# Patient Record
Sex: Male | Born: 1975 | Race: White | Hispanic: No | Marital: Married | Smoking: Current every day smoker
Health system: Southern US, Community
[De-identification: ages and names within clinical notes are randomized; demographics above are authoritative.]

## PROBLEM LIST (undated history)

## (undated) DIAGNOSIS — F1010 Alcohol abuse, uncomplicated: Secondary | ICD-10-CM

## (undated) DIAGNOSIS — S8990XA Unspecified injury of unspecified lower leg, initial encounter: Secondary | ICD-10-CM

## (undated) DIAGNOSIS — B009 Herpesviral infection, unspecified: Secondary | ICD-10-CM

## (undated) DIAGNOSIS — F411 Generalized anxiety disorder: Secondary | ICD-10-CM

## (undated) DIAGNOSIS — E785 Hyperlipidemia, unspecified: Secondary | ICD-10-CM

## (undated) DIAGNOSIS — R7303 Prediabetes: Secondary | ICD-10-CM

## (undated) DIAGNOSIS — E559 Vitamin D deficiency, unspecified: Secondary | ICD-10-CM

## (undated) HISTORY — DX: Generalized anxiety disorder: F41.1

## (undated) HISTORY — PX: DEBRIDEMENT LEG: SUR390

## (undated) HISTORY — DX: Prediabetes: R73.03

## (undated) HISTORY — DX: Unspecified injury of unspecified lower leg, initial encounter: S89.90XA

## (undated) HISTORY — DX: Alcohol abuse, uncomplicated: F10.10

## (undated) HISTORY — DX: Vitamin D deficiency, unspecified: E55.9

## (undated) HISTORY — DX: Herpesviral infection, unspecified: B00.9

## (undated) HISTORY — DX: Hyperlipidemia, unspecified: E78.5

---

## 1994-11-29 HISTORY — PX: ANKLE FRACTURE SURGERY: SHX122

## 2009-07-27 ENCOUNTER — Inpatient Hospital Stay (HOSPITAL_COMMUNITY): Admission: EM | Admit: 2009-07-27 | Discharge: 2009-07-28 | Payer: Self-pay | Admitting: Emergency Medicine

## 2010-09-04 ENCOUNTER — Ambulatory Visit (HOSPITAL_COMMUNITY): Admission: RE | Admit: 2010-09-04 | Discharge: 2010-09-04 | Payer: Self-pay | Admitting: Internal Medicine

## 2010-10-12 ENCOUNTER — Ambulatory Visit (HOSPITAL_COMMUNITY): Admission: RE | Admit: 2010-10-12 | Discharge: 2010-10-13 | Payer: Self-pay | Admitting: Orthopedic Surgery

## 2010-11-27 ENCOUNTER — Ambulatory Visit (HOSPITAL_COMMUNITY)
Admission: RE | Admit: 2010-11-27 | Discharge: 2010-11-27 | Payer: Self-pay | Source: Home / Self Care | Attending: Internal Medicine | Admitting: Internal Medicine

## 2010-12-18 ENCOUNTER — Ambulatory Visit (HOSPITAL_COMMUNITY): Admission: RE | Admit: 2010-12-18 | Payer: Self-pay | Source: Home / Self Care | Admitting: Orthopedic Surgery

## 2011-01-29 ENCOUNTER — Other Ambulatory Visit: Payer: Self-pay | Admitting: Orthopedic Surgery

## 2011-01-29 ENCOUNTER — Ambulatory Visit (HOSPITAL_BASED_OUTPATIENT_CLINIC_OR_DEPARTMENT_OTHER)
Admission: RE | Admit: 2011-01-29 | Discharge: 2011-01-29 | Disposition: A | Discharge status: Home / Self Care | Payer: 59 | Source: Ambulatory Visit | Attending: Orthopedic Surgery | Admitting: Orthopedic Surgery

## 2011-01-29 DIAGNOSIS — L910 Hypertrophic scar: Secondary | ICD-10-CM | POA: Insufficient documentation

## 2011-01-29 DIAGNOSIS — Z1833 Retained wood fragments: Secondary | ICD-10-CM | POA: Insufficient documentation

## 2011-01-29 DIAGNOSIS — L923 Foreign body granuloma of the skin and subcutaneous tissue: Secondary | ICD-10-CM | POA: Insufficient documentation

## 2011-01-29 LAB — POCT HEMOGLOBIN-HEMACUE: Hemoglobin: 15 g/dL (ref 13.0–17.0)

## 2011-02-01 LAB — WOUND CULTURE

## 2011-02-03 LAB — ANAEROBIC CULTURE

## 2011-02-09 LAB — CULTURE, ROUTINE-ABSCESS

## 2011-02-09 LAB — ANAEROBIC CULTURE

## 2011-02-09 LAB — CBC
HCT: 41.1 % (ref 39.0–52.0)
Hemoglobin: 13.5 g/dL (ref 13.0–17.0)
MCH: 29.7 pg (ref 26.0–34.0)
MCHC: 32.8 g/dL (ref 30.0–36.0)
MCV: 90.3 fL (ref 78.0–100.0)
Platelets: 184 10*3/uL (ref 150–400)
RBC: 4.55 MIL/uL (ref 4.22–5.81)
RDW: 12.3 % (ref 11.5–15.5)
WBC: 9.8 10*3/uL (ref 4.0–10.5)

## 2011-02-09 LAB — PROTIME-INR
INR: 1.07 (ref 0.00–1.49)
Prothrombin Time: 14.1 seconds (ref 11.6–15.2)

## 2011-02-09 LAB — SURGICAL PCR SCREEN
MRSA, PCR: NEGATIVE
Staphylococcus aureus: NEGATIVE

## 2011-03-06 LAB — CBC
HCT: 39.5 % (ref 39.0–52.0)
Hemoglobin: 13.5 g/dL (ref 13.0–17.0)
MCHC: 34.1 g/dL (ref 30.0–36.0)
MCV: 94.4 fL (ref 78.0–100.0)
Platelets: 191 10*3/uL (ref 150–400)
RBC: 4.19 MIL/uL — ABNORMAL LOW (ref 4.22–5.81)
RDW: 13.4 % (ref 11.5–15.5)
WBC: 14.9 10*3/uL — ABNORMAL HIGH (ref 4.0–10.5)

## 2011-03-06 LAB — TYPE AND SCREEN
ABO/RH(D): A POS
Antibody Screen: NEGATIVE

## 2011-03-06 LAB — LACTIC ACID, PLASMA: Lactic Acid, Venous: 3.1 mmol/L — ABNORMAL HIGH (ref 0.5–2.2)

## 2011-03-06 LAB — PROTIME-INR
INR: 1.1 (ref 0.00–1.49)
Prothrombin Time: 14.5 seconds (ref 11.6–15.2)

## 2011-03-06 LAB — POCT I-STAT, CHEM 8
BUN: 22 mg/dL (ref 6–23)
Calcium, Ion: 1.16 mmol/L (ref 1.12–1.32)
Chloride: 104 mEq/L (ref 96–112)
Creatinine, Ser: 1.2 mg/dL (ref 0.4–1.5)
Glucose, Bld: 187 mg/dL — ABNORMAL HIGH (ref 70–99)
HCT: 42 % (ref 39.0–52.0)
Hemoglobin: 14.3 g/dL (ref 13.0–17.0)
Potassium: 3.7 mEq/L (ref 3.5–5.1)
Sodium: 138 mEq/L (ref 135–145)
TCO2: 24 mmol/L (ref 0–100)

## 2011-03-06 LAB — ABO/RH: ABO/RH(D): A POS

## 2011-04-13 NOTE — H&P (Signed)
NAMESCHYLER, COUNSELL NO.:  [id number]   MEDICAL RECORD NO.:  [id number]          PATIENT TYPE:  EMS   LOCATION:  MAJO                         FACILITY:  [facility]   PHYSICIAN:  Sandria Bales. Ezzard Standing, M.D.  DATE OF BIRTH:  1976-08-06   DATE OF ADMISSION:  07/27/2009  DATE OF DISCHARGE:                              HISTORY & PHYSICAL   HISTORY OF PRESENT ILLNESS:  This is a 35 year old white male who was  upgraded to a Gold Trauma in the emergency room, I guess because of  mechanism of injury.   His story is, he was in a tree, cutting branches, fell approximately 12  feet, and impelled a tree branch on the inner aspect of his right thigh.  He then got in the car, drove home, and his wife brought him to the  [facility] ER.  He has one loose tooth but he has no head injury, no  loss of consciousness, no chest injury, and no abdominal injury.   He clearly was stable enough to drive a car (a short distance) and  stable enough to let his wife drive him to the ER.   PAST MEDICAL HISTORY:  He has no allergies.   MEDICATIONS:  He is on no medications.   REVIEW OF SYSTEMS:  NEUROLOGIC:  No seizure or loss of consciousness.  PULMONARY:  No history of pneumonia or lung disease.  CARDIAC:  No heart disease or chest pain.  GASTROINTESTINAL:  No history peptic ulcer disease or liver disease.  UROLOGIC:  No history of kidney stones or kidney infections.   His wife was there with him in the emergency room.   PHYSICAL EXAMINATION:  VITAL SIGNS:  His blood pressure is 111/70, pulse  68, and he is saturating at 100%.  GENERAL:  He is a well-nourished, healthy male, alert and cooperative.  NEUROLOGIC:  Grossly intact and oriented.  HEENT:  His pupils are about 3-4 mm and reactive.  He has a loose upper  left incisor but no other obvious oral lesion.  His TMs are unremarkable  bilaterally.  He has no facial injury.  NECK:  Nontender without any evidence of injury.  He moves it  without  pain.  LUNGS:  Clear to auscultation with symmetric breath sounds.  HEART:  Regular rate and rhythm.  I hear no murmur or rub.  ABDOMEN:  Soft.  His pelvis is stable.  He has no evidence of any  abdominal injury or laceration.  BACK:  No laceration or injury to his back.  EXTREMITIES:  In his right thigh, he has a branch which is about 3 cm in  diameter poking out the left medial thigh.  The branch looks like it is  directed posterior to his vascular bundle.  I can feel palpable dorsalis  pedis on the right foot.  His sensation is grossly intact on the right  side.  His left leg and both arms are otherwise unremarkable.   IMPRESSION:  1. Impelled branch, right thigh.  We will plan to x-ray this, but      doubt  any bony injuries as he got up to walk to      his car and drove.  I do not have any evidence of a vascular      injury.  I will take him to the operating room and I will remove      the branch and debride the wound.  2. He has a loose upper left second incisor.      Sandria Bales. Ezzard Standing, M.D.  Electronically Signed     DHN/MEDQ  D:  07/27/2009  T:  07/27/2009  Job:  [id number]

## 2011-04-13 NOTE — Discharge Summary (Signed)
NAMEKHAZA, BLANSETT NO.:  [id number]   MEDICAL RECORD NO.:  [id number]          PATIENT TYPE:  INP   LOCATION:  5128                         FACILITY:  [facility]   PHYSICIAN:  Cherylynn Ridges, M.D.    DATE OF BIRTH:  March 28, 1976   DATE OF ADMISSION:  07/27/2009  DATE OF DISCHARGE:  07/28/2009                               DISCHARGE SUMMARY   DISCHARGE DIAGNOSES:  1. Fall.  2. Penetrating injury to right thigh.   CONSULTANTS:  None.   PROCEDURES:  Incision and drainage of right thigh wound with placement  of Penrose drain and closure by Dr. Ezzard Standing.   HISTORY OF PRESENT ILLNESS:  This is a 35 year old white male who was up  in a tree cutting some branches when he fell impaling his right thigh on  a branch.  This was cut off and he drove himself home and then his wife  drove him to the hospital.  He was made a level I trauma on arrival.  His workup did not demonstrate any significant vascular injury, and he  was taken to the operating room for significant I and D and closure.  This was carried out by Dr. Ezzard Standing without difficulty.  He was  transferred to the floor for further care.   HOSPITAL COURSE:  The patient did well in the hospital.  He was able to  ambulate without significant difficulty and had his pain controlled on  oral medication.  During his dressing change the following morning, the  Penrose was pulled out, but he did not have significant drainage nor any  erythema or signs of purulence.  He was able to be discharged home in  good condition in the care of his wife.   DISCHARGE MEDICATIONS:  Norco 5/325 take 1-2 p.o. q.4 h. p.r.n. pain,  #60 with no refill.   FOLLOWUP:  The patient will need to follow up in the Trauma Service  Clinic on August 07, 2009, for wound check and suture removal.  If he  has questions or concerns prior to that, he will call.      Earney Hamburg, P.A.      Cherylynn Ridges, M.D.  Electronically Signed    MJ/MEDQ   D:  07/28/2009  T:  07/29/2009  Job:  [id number]

## 2011-04-13 NOTE — Op Note (Signed)
NAMEMORDECHAI, Tracy Barrett NO.:  [id number]   MEDICAL RECORD NO.:  [id number]          PATIENT TYPE:  INP   LOCATION:  5128                         FACILITY:  [facility]   PHYSICIAN:  Sandria Bales. Ezzard Standing, M.D.  DATE OF BIRTH:  Jul 17, 1976   DATE OF PROCEDURE:  07/27/2009  DATE OF DISCHARGE:                               OPERATIVE REPORT   Date of Surgery ?   PREOPERATIVE DIAGNOSIS:  Branch impelled to medial aspect of right  thigh.   POSTOPERATIVE DIAGNOSIS:  Branch impelled to medial aspect of right  thigh, penetration approximately 18 cm.   PROCEDURE:  Removal of foreign body, irrigation of right thigh wound  with 6 liters of pulse lavage.   SURGEON:  Sandria Bales. Ezzard Standing, MD   FIRST ASSISTANT:  None.   ANESTHESIA:  General endotracheal.   ESTIMATED BLOOD LOSS:  Minimal.   INDICATIONS FOR PROCEDURE:  [name] is a 35 year old white male who  fell from a tree about 12 feet, impelled a branch on his right thigh,  came to the [facility] Emergency Room in stable condition.  He is now  come in to the operating room for removal of foreign body and  debridement.   He has been able to walk since being impelled and by x-ray, he has no  obvious bony injury.  He has palpable pedal pulses.  I do not think he  has any vascular injury.  He got a tetanus shot in the emergency room.   I discussed with him and his wife the need for debridement of this  wound.  The risks include, but are not limited to, infection, nerve  injury, which have already done and some other occult injury.   OPERATIVE NOTE:  The patient was placed in the supine position, given  the general anesthetic, was supervised by Dr. Adonis Huguenin, was given 1 g  of Ancef at the initiation of procedure.  A time out was held  identifying the patient and the procedure.   I prepped his right thigh and abdomen with Betadine solution.  I went on  and pulled the branch out before prepping just to get it out the way.  He  really had not much oozing from the wound at all.  After the wound  was prepped, we did a time-out to identifying the patient and procedure  according to the surgical checklist.  I then irrigated out the right  thigh wound with a pulse lavage using approximately 6 liters of saline  irrigation.  I then loosely closed the wound with interrupted 2-0 nylon  sutures.   I placed a Penrose drain in the wound covered with 4 x 4s and a Kerlix.  He tolerated the procedure well, was transported to the recovery room in  good condition.      Sandria Bales. Ezzard Standing, M.D.  Electronically Signed     DHN/MEDQ  D:  07/27/2009  T:  07/28/2009  Job:  [id number]

## 2011-06-08 NOTE — Op Note (Signed)
Tracy Barrett, Tracy Barrett                 ACCOUNT NO.:  [id number]  MEDICAL RECORD NO.:  [id number]          PATIENT TYPE:  AMB  LOCATION:  SDS                          FACILITY:  [facility]  PHYSICIAN:  Harvie Junior, M.D.   DATE OF BIRTH:  [birth year]  DATE OF PROCEDURE:  01/29/2011 DATE OF DISCHARGE:                              OPERATIVE REPORT   PREOPERATIVE DIAGNOSIS:  Retained foreign body with abscess right posterior thigh.  POSTOPERATIVE DIAGNOSIS:  Retained foreign body with abscess right posterior thigh.  PROCEDURES: 1. Removal of foreign body deep right thigh. 2. Irrigation and debridement of abscess right thigh. 3. Exploration defined and debridement of a large foreign body sac in     the right thigh. 4. Revision of previous keloid-type scar  SURGEON:  Harvie Junior, MD.  ASSISTANT:  Marshia Ly, PA  ANESTHESIA:  General.  BRIEF HISTORY:  Tracy Barrett is a 35 year old male with a long history having had a tree branch sort of going into his right thigh.  He had been treated previously by Dr. Turner Daniels who had found multiple parts of bark in his leg, and had irrigated and debrided it thoroughly.  Many months after this debridement, he began developing sort of abscess in his leg or some sort of a mass.  We saw him.  A repeat MRI was obtained, which showed there was some questionable stool foreign body versus some pus, and then it tracked down into the leg.  We evaluated him and felt that Dr. Turner Daniels had done everything correct, and that there potentially could be some more foreign body.  It was unclear, but he certainly had this new sort of granulation sac, and as such we felt that exploration was appropriate.  The patient wanted [place] to do it and he was brought to the operating room for exploration and debridement as needed.  DESCRIPTION OF PROCEDURE:  Patient brought to the operating room.  After adequate anesthesia obtained with a general anesthetic, the patient was placed  supine on the operating table, then was moved into the prone position.  Attention then turned to the right leg where after routine prep and drape, an incision was made which excised and ellipsed the old scar.  Once this keloid-type scar had been excised, we extended the incision proximally and slightly medially until allowed [place] to get access over medially and got down through this incision.  There was a very dense sort of a scar-type tissue.  We began dissecting this and it became clear that there was some sort of the sac.  We ultimately made of slight amount of an enterotomy, and in that enterotomy, there was some fluid which escaped.  We cultured it and began again more fully defining the sac.  We got the sac fully defined and excised.  Unfortunately, the sac had a tracking limb, and the tracking limb went straight down to the femur posteriorly and really started going medial to the femur.  We tracked it down very carefully and cautiously.  We could put an instrument in it. You could see the sac sort of tracked right down  to bone, kind of had an attachment to bone.  We found one large piece of tree bark which was excised and continued to follow this track all the way down, started to get thinner and thinner, and as we got to the medial side of the femur and tracking anteriorly, we felt that we could not track this any further.  At this point, the stalk was amputated, and a gloved finger could be placed medial to the femur following this area, and there were no additional hard of pieces which could be identified or felt in that area.  At this point, we did a thorough irrigation and debridement of the wound, controlled all bleeding with electrocautery, and sort of tried to milk this area for any additional pieces or problems.  Seeing none, the wound was then irrigated, suctioned dry.  We then closed in layers with Vicryl and staples.  A sterile compressive dressing was applied and the  patient taken to the recovery room where he was noted to be in satisfactory condition.  The sac and foreign bodies were sent to Pathology, and the patient was discharged to home.  We will follow him back in the office next week.     Harvie Junior, M.D.     Ranae Plumber  D:  01/29/2011  T:  01/30/2011  Job:  [id number]  Electronically Signed by Jodi Geralds M.D. on 02/03/2011 01:27:44 PM

## 2011-11-05 ENCOUNTER — Ambulatory Visit (HOSPITAL_COMMUNITY)
Admission: RE | Admit: 2011-11-05 | Discharge: 2011-11-05 | Disposition: A | Discharge status: Home / Self Care | Payer: 59 | Source: Ambulatory Visit | Attending: Internal Medicine | Admitting: Internal Medicine

## 2011-11-05 ENCOUNTER — Other Ambulatory Visit (HOSPITAL_COMMUNITY): Payer: Self-pay | Admitting: Internal Medicine

## 2011-11-05 DIAGNOSIS — Z0000 Encounter for general adult medical examination without abnormal findings: Secondary | ICD-10-CM | POA: Insufficient documentation

## 2011-11-05 DIAGNOSIS — I10 Essential (primary) hypertension: Secondary | ICD-10-CM

## 2011-11-05 DIAGNOSIS — R059 Cough, unspecified: Secondary | ICD-10-CM | POA: Insufficient documentation

## 2011-11-05 DIAGNOSIS — R05 Cough: Secondary | ICD-10-CM | POA: Insufficient documentation

## 2011-11-05 DIAGNOSIS — F17200 Nicotine dependence, unspecified, uncomplicated: Secondary | ICD-10-CM | POA: Insufficient documentation

## 2011-11-25 LAB — FECAL OCCULT BLOOD, GUAIAC: Fecal Occult Blood: NEGATIVE

## 2012-05-25 IMAGING — CR DG CHEST 2V
2 series · 2 of 2 positions shown · non-contrast
Comparison: 07/27/2009

CLINICAL DATA: Smoker with some cough during the last week.
Annual exam

[view not recorded (1 of 2)]
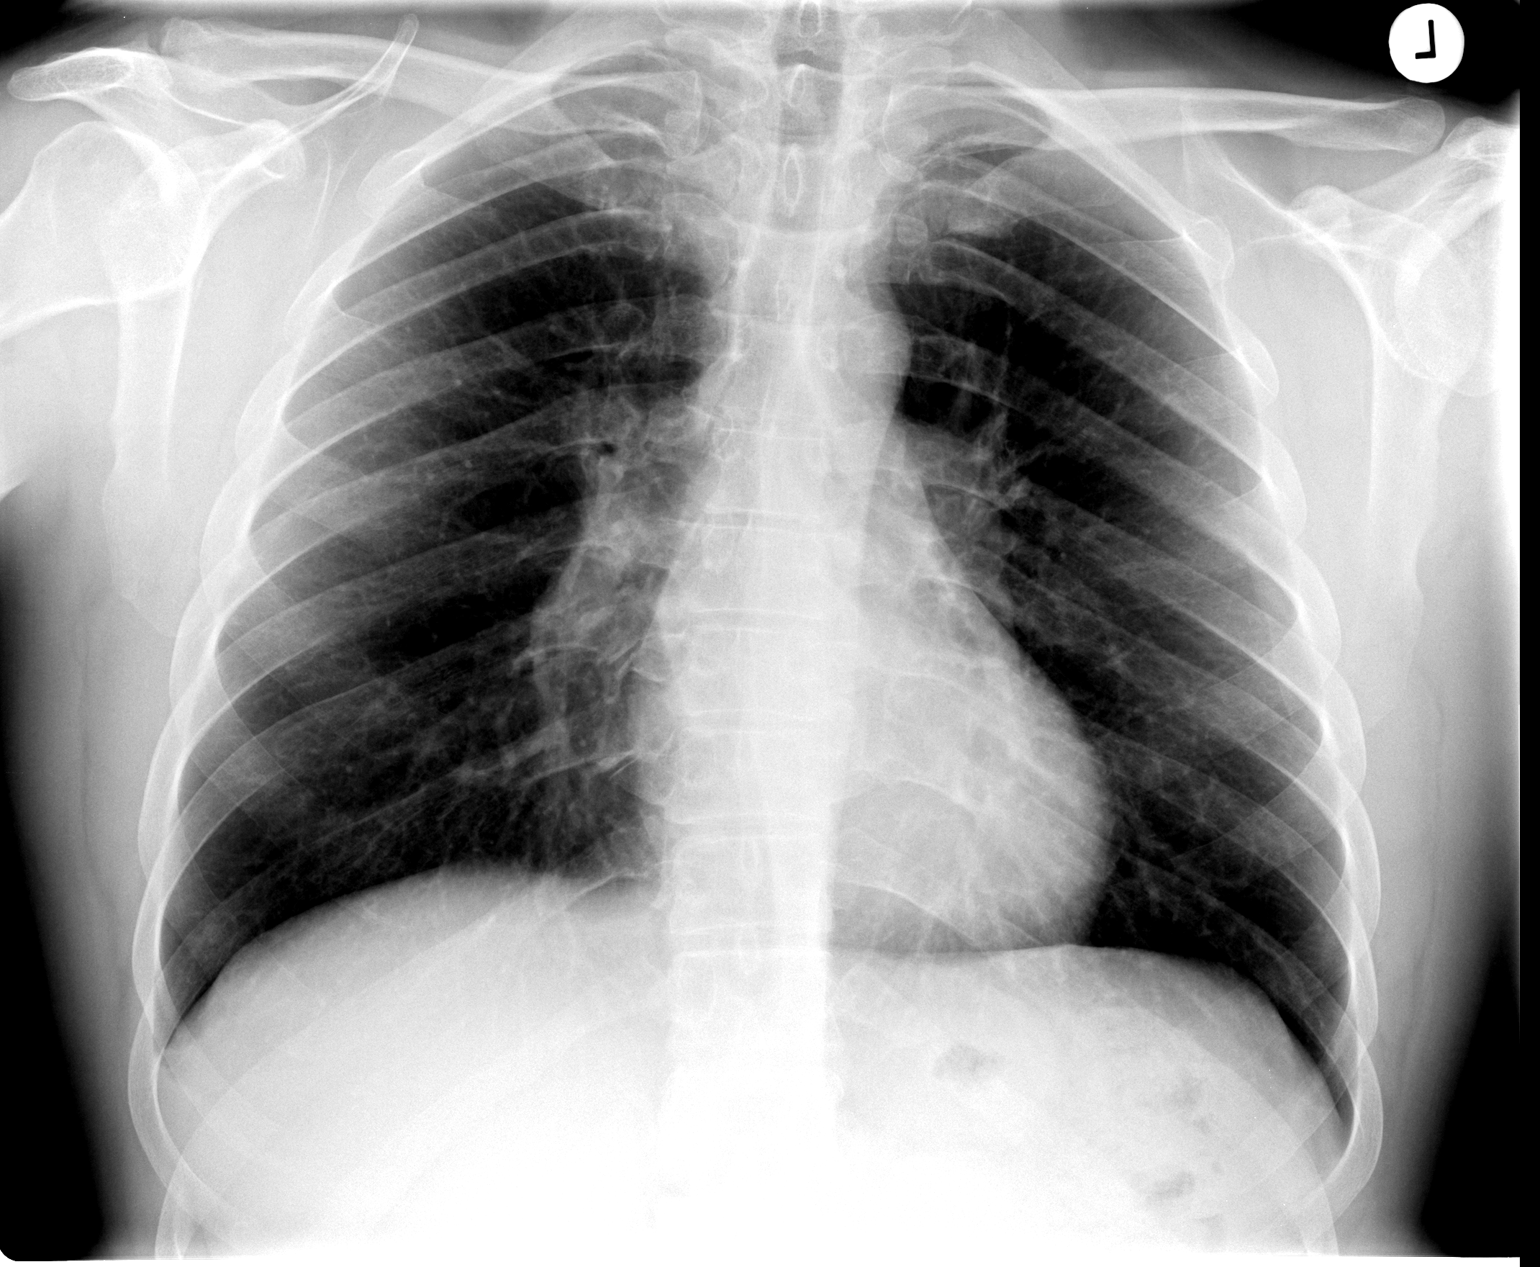

[view not recorded (2 of 2)]
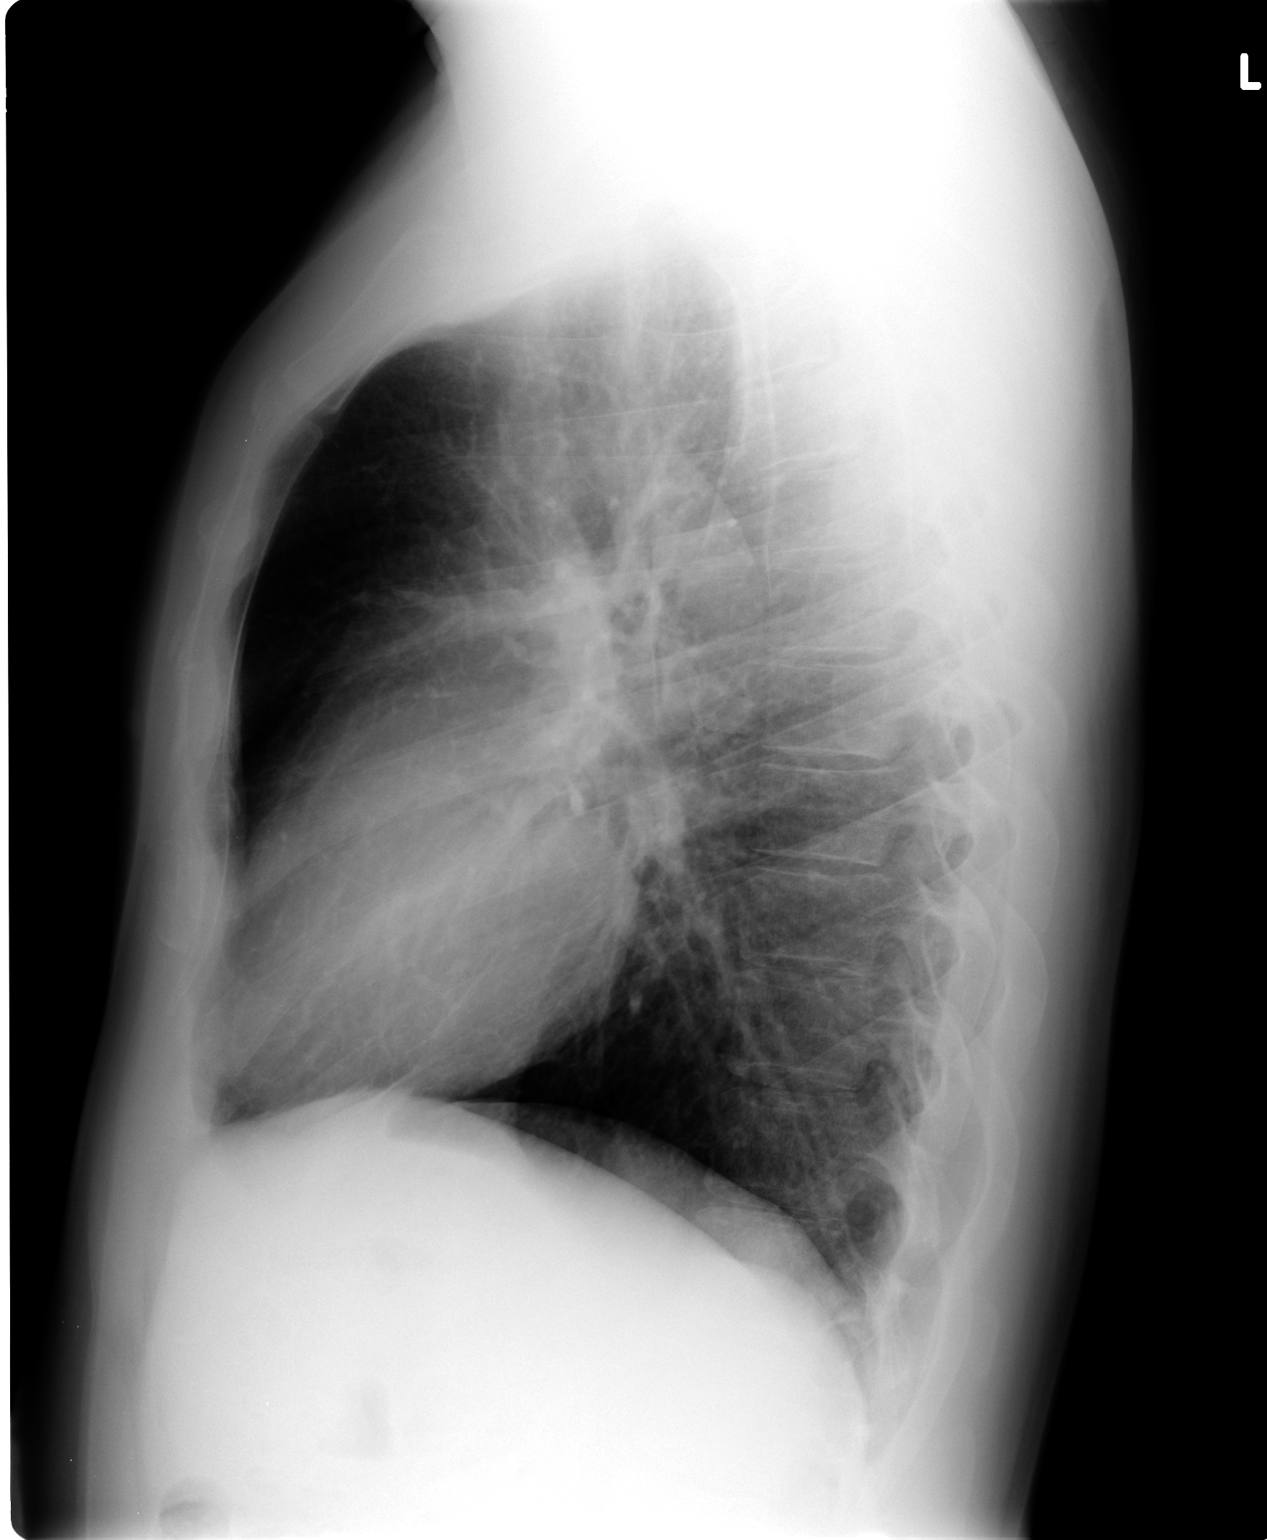

[2 of 2 positions shown; findings below may reference images not displayed]

FINDINGS: Heart and mediastinal contours are within normal limits.
The lung fields are clear with no signs of focal infiltrate or
congestive failure.  No pleural fluid or peribronchial cuffing is
seen.

Bony structures appear intact.
IMPRESSION: Stable cardiopulmonary appearance with no new focal or
acute abnormality noted

## 2012-11-29 HISTORY — PX: VASECTOMY: SHX75

## 2013-11-24 ENCOUNTER — Encounter: Payer: Self-pay | Admitting: Internal Medicine

## 2013-11-24 DIAGNOSIS — E559 Vitamin D deficiency, unspecified: Secondary | ICD-10-CM | POA: Insufficient documentation

## 2013-11-24 DIAGNOSIS — E785 Hyperlipidemia, unspecified: Secondary | ICD-10-CM | POA: Insufficient documentation

## 2013-11-27 ENCOUNTER — Encounter: Payer: Self-pay | Admitting: Emergency Medicine

## 2013-11-27 ENCOUNTER — Ambulatory Visit (INDEPENDENT_AMBULATORY_CARE_PROVIDER_SITE_OTHER): Payer: 59 | Admitting: Emergency Medicine

## 2013-11-27 VITALS — BP 122/70 | HR 74 | Temp 98.2°F | Resp 18 | Ht 67.25 in | Wt 194.0 lb

## 2013-11-27 DIAGNOSIS — Z23 Encounter for immunization: Secondary | ICD-10-CM

## 2013-11-27 DIAGNOSIS — Z0000 Encounter for general adult medical examination without abnormal findings: Secondary | ICD-10-CM

## 2013-11-27 DIAGNOSIS — Z111 Encounter for screening for respiratory tuberculosis: Secondary | ICD-10-CM

## 2013-11-27 DIAGNOSIS — F411 Generalized anxiety disorder: Secondary | ICD-10-CM

## 2013-11-27 DIAGNOSIS — S8991XS Unspecified injury of right lower leg, sequela: Secondary | ICD-10-CM

## 2013-11-27 DIAGNOSIS — Z1212 Encounter for screening for malignant neoplasm of rectum: Secondary | ICD-10-CM

## 2013-11-27 DIAGNOSIS — Z3009 Encounter for other general counseling and advice on contraception: Secondary | ICD-10-CM

## 2013-11-27 DIAGNOSIS — F17200 Nicotine dependence, unspecified, uncomplicated: Secondary | ICD-10-CM

## 2013-11-27 DIAGNOSIS — E782 Mixed hyperlipidemia: Secondary | ICD-10-CM

## 2013-11-27 LAB — HEMOGLOBIN A1C: Hgb A1c MFr Bld: 5.9 % — ABNORMAL HIGH (ref <5.7)

## 2013-11-27 LAB — BASIC METABOLIC PANEL WITH GFR
BUN: 12 mg/dL (ref 6–23)
Chloride: 102 mEq/L (ref 96–112)
Creat: 0.92 mg/dL (ref 0.50–1.35)
GFR, Est African American: >89 mL/min
GFR, Est Non African American: >89 mL/min
Glucose, Bld: 87 mg/dL (ref 70–99)
Potassium: 4.7 mEq/L (ref 3.5–5.3)
Sodium: 139 mEq/L (ref 135–145)

## 2013-11-27 LAB — LIPID PANEL
Cholesterol: 235 mg/dL — ABNORMAL HIGH (ref 0–200)
HDL: 39 mg/dL — ABNORMAL LOW (ref >39)
LDL Cholesterol: 156 mg/dL — ABNORMAL HIGH (ref 0–99)
Total CHOL/HDL Ratio: 6 Ratio
VLDL: 40 mg/dL (ref 0–40)

## 2013-11-27 LAB — CBC WITH DIFFERENTIAL/PLATELET
Basophils Absolute: 0 10*3/uL (ref 0.0–0.1)
Basophils Relative: 0 % (ref 0–1)
Eosinophils Absolute: 0.1 10*3/uL (ref 0.0–0.7)
HCT: 44.3 % (ref 39.0–52.0)
Hemoglobin: 15.4 g/dL (ref 13.0–17.0)
Lymphs Abs: 3.1 10*3/uL (ref 0.7–4.0)
MCH: 30.9 pg (ref 26.0–34.0)
MCV: 88.8 fL (ref 78.0–100.0)
Monocytes Relative: 10 % (ref 3–12)
Neutrophils Relative %: 60 % (ref 43–77)
RBC: 4.99 MIL/uL (ref 4.22–5.81)
RDW: 13.7 % (ref 11.5–15.5)

## 2013-11-27 LAB — HEPATIC FUNCTION PANEL
ALT: 20 U/L (ref 0–53)
AST: 18 U/L (ref 0–37)
Alkaline Phosphatase: 67 U/L (ref 39–117)
Bilirubin, Direct: 0.1 mg/dL (ref 0.0–0.3)
Indirect Bilirubin: 0.4 mg/dL (ref 0.0–0.9)
Total Bilirubin: 0.5 mg/dL (ref 0.3–1.2)
Total Protein: 7.3 g/dL (ref 6.0–8.3)

## 2013-11-27 LAB — TSH: TSH: 3.352 u[IU]/mL (ref 0.350–4.500)

## 2013-11-27 MED ORDER — ALPRAZOLAM 0.25 MG PO TABS: 0.2500 mg | ORAL_TABLET | Freq: Three times a day (TID) | ORAL | Status: DC | PRN

## 2013-11-27 NOTE — Patient Instructions (Addendum)
Fat and Cholesterol Control Diet Fat and cholesterol levels in your blood and organs are influenced by your diet. High levels of fat and cholesterol may lead to diseases of the heart, small and large blood vessels, gallbladder, liver, and pancreas. CONTROLLING FAT AND CHOLESTEROL WITH DIET Although exercise and lifestyle factors are important, your diet is key. That is because certain foods are known to raise cholesterol and others to lower it. The goal is to balance foods for their effect on cholesterol and more importantly, to replace saturated and trans fat with other types of fat, such as monounsaturated fat, polyunsaturated fat, and omega-3 fatty acids. On average, a person should consume no more than 15 to 17 g of saturated fat daily. Saturated and trans fats are considered "bad" fats, and they will raise LDL cholesterol. Saturated fats are primarily found in animal products such as meats, butter, and cream. However, that does not mean you need to give up all your favorite foods. Today, there are good tasting, low-fat, low-cholesterol substitutes for most of the things you like to eat. Choose low-fat or nonfat alternatives. Choose round or loin cuts of red meat. These types of cuts are lowest in fat and cholesterol. Chicken (without the skin), fish, veal, and ground turkey breast are great choices. Eliminate fatty meats, such as hot dogs and salami. Even shellfish have little or no saturated fat. Have a 3 oz (85 g) portion when you eat lean meat, poultry, or fish. Trans fats are also called "partially hydrogenated oils." They are oils that have been scientifically manipulated so that they are solid at room temperature resulting in a longer shelf life and improved taste and texture of foods in which they are added. Trans fats are found in stick margarine, some tub margarines, cookies, crackers, and baked goods.  When baking and cooking, oils are a great substitute for butter. The monounsaturated oils are  especially beneficial since it is believed they lower LDL and raise HDL. The oils you should avoid entirely are saturated tropical oils, such as coconut and palm.  Remember to eat a lot from food groups that are naturally free of saturated and trans fat, including fish, fruit, vegetables, beans, grains (barley, rice, couscous, bulgur wheat), and pasta (without cream sauces).  IDENTIFYING FOODS THAT LOWER FAT AND CHOLESTEROL  Soluble fiber may lower your cholesterol. This type of fiber is found in fruits such as apples, vegetables such as broccoli, potatoes, and carrots, legumes such as beans, peas, and lentils, and grains such as barley. Foods fortified with plant sterols (phytosterol) may also lower cholesterol. You should eat at least 2 g per day of these foods for a cholesterol lowering effect.  Read package labels to identify low-saturated fats, trans fat free, and low-fat foods at the supermarket. Select cheeses that have only 2 to 3 g saturated fat per ounce. Use a heart-healthy tub margarine that is free of trans fats or partially hydrogenated oil. When buying baked goods (cookies, crackers), avoid partially hydrogenated oils. Breads and muffins should be made from whole grains (whole-wheat or whole oat flour, instead of "flour" or "enriched flour"). Buy non-creamy canned soups with reduced salt and no added fats.  FOOD PREPARATION TECHNIQUES  Never deep-fry. If you must fry, either stir-fry, which uses very little fat, or use non-stick cooking sprays. When possible, broil, bake, or roast meats, and steam vegetables. Instead of putting butter or margarine on vegetables, use lemon and herbs, applesauce, and cinnamon (for squash and sweet potatoes). Use nonfat   yogurt, salsa, and low-fat dressings for salads.  LOW-SATURATED FAT / LOW-FAT FOOD SUBSTITUTES Meats / Saturated Fat (g)  Avoid: Steak, marbled (3 oz/85 g) / 11 g  Choose: Steak, lean (3 oz/85 g) / 4 g  Avoid: Hamburger (3 oz/85 g) / 7  g  Choose: Hamburger, lean (3 oz/85 g) / 5 g  Avoid: Ham (3 oz/85 g) / 6 g  Choose: Ham, lean cut (3 oz/85 g) / 2.4 g  Avoid: Chicken, with skin, dark meat (3 oz/85 g) / 4 g  Choose: Chicken, skin removed, dark meat (3 oz/85 g) / 2 g  Avoid: Chicken, with skin, light meat (3 oz/85 g) / 2.5 g  Choose: Chicken, skin removed, light meat (3 oz/85 g) / 1 g Dairy / Saturated Fat (g)  Avoid: Whole milk (1 cup) / 5 g  Choose: Low-fat milk, 2% (1 cup) / 3 g  Choose: Low-fat milk, 1% (1 cup) / 1.5 g  Choose: Skim milk (1 cup) / 0.3 g  Avoid: Hard cheese (1 oz/28 g) / 6 g  Choose: Skim milk cheese (1 oz/28 g) / 2 to 3 g  Avoid: Cottage cheese, 4% fat (1 cup) / 6.5 g  Choose: Low-fat cottage cheese, 1% fat (1 cup) / 1.5 g  Avoid: Ice cream (1 cup) / 9 g  Choose: Sherbet (1 cup) / 2.5 g  Choose: Nonfat frozen yogurt (1 cup) / 0.3 g  Choose: Frozen fruit bar / trace  Avoid: Whipped cream (1 tbs) / 3.5 g  Choose: Nondairy whipped topping (1 tbs) / 1 g Condiments / Saturated Fat (g)  Avoid: Mayonnaise (1 tbs) / 2 g  Choose: Low-fat mayonnaise (1 tbs) / 1 g  Avoid: Butter (1 tbs) / 7 g  Choose: Extra light margarine (1 tbs) / 1 g  Avoid: Coconut oil (1 tbs) / 11.8 g  Choose: Olive oil (1 tbs) / 1.8 g  Choose: Corn oil (1 tbs) / 1.7 g  Choose: Safflower oil (1 tbs) / 1.2 g  Choose: Sunflower oil (1 tbs) / 1.4 g  Choose: Soybean oil (1 tbs) / 2.4 g  Choose: Canola oil (1 tbs) / 1 g Document Released: 11/15/2005 Document Revised: 03/12/2013 Document Reviewed: 05/06/2011 [employer] Patient Information 2014 [place], [place]. Generalized Anxiety Disorder Generalized anxiety disorder (GAD) is a mental disorder. It interferes with life functions, including relationships, work, and school. GAD is different from normal anxiety, which everyone experiences at some point in their lives in response to specific life events and activities. Normal anxiety actually helps [place] prepare  for and get through these life events and activities. Normal anxiety goes away after the event or activity is over.  GAD causes anxiety that is not necessarily related to specific events or activities. It also causes excess anxiety in proportion to specific events or activities. The anxiety associated with GAD is also difficult to control. GAD can vary from mild to severe. People with severe GAD can have intense waves of anxiety with physical symptoms (panic attacks).  SYMPTOMS The anxiety and worry associated with GAD are difficult to control. This anxiety and worry are related to many life events and activities and also occur more days than not for 6 months or longer. People with GAD also have three or more of the following symptoms (one or more in children):  Restlessness.   Fatigue.  Difficulty concentrating.   Irritability.  Muscle tension.  Difficulty sleeping or unsatisfying sleep. DIAGNOSIS GAD is diagnosed through an  assessment by your caregiver. Your caregiver will ask you questions aboutyour mood,physical symptoms, and events in your life. Your caregiver may ask you about your medical history and use of alcohol or drugs, including prescription medications. Your caregiver may also do a physical exam and blood tests. Certain medical conditions and the use of certain substances can cause symptoms similar to those associated with GAD. Your caregiver may refer you to a mental health specialist for further evaluation. TREATMENT The following therapies are usually used to treat GAD:   Medication Antidepressant medication usually is prescribed for long-term daily control. Antianxiety medications may be added in severe cases, especially when panic attacks occur.   Talk therapy (psychotherapy) Certain types of talk therapy can be helpful in treating GAD by providing support, education, and guidance. A form of talk therapy called cognitive behavioral therapy can teach you healthy ways to  think about and react to daily life events and activities.  Stress managementtechniques These include yoga, meditation, and exercise and can be very helpful when they are practiced regularly. A mental health specialist can help determine which treatment is best for you. Some people see improvement with one therapy. However, other people require a combination of therapies. Document Released: 03/12/2013 Document Reviewed: 03/12/2013 [facility] Patient Information 2014 [place], [place]. Smoking Cessation, Tips for Success YOU CAN QUIT SMOKING If you are ready to quit smoking, congratulations! You have chosen to help yourself be healthier. Cigarettes bring nicotine, tar, carbon monoxide, and other irritants into your body. Your lungs, heart, and blood vessels will be able to work better without these poisons. There are many different ways to quit smoking. Nicotine gum, nicotine patches, a nicotine inhaler, or nicotine nasal spray can help with physical craving. Hypnosis, support groups, and medicines help break the habit of smoking. Here are some tips to help you quit for good.  Throw away all cigarettes.  Clean and remove all ashtrays from your home, work, and car.  On a card, write down your reasons for quitting. Carry the card with you and read it when you get the urge to smoke.  Cleanse your body of nicotine. Drink enough water and fluids to keep your urine clear or pale yellow. Do this after quitting to flush the nicotine from your body.  Learn to predict your moods. Do not let a bad situation be your excuse to have a cigarette. Some situations in your life might tempt you into wanting a cigarette.  Never have "just one" cigarette. It leads to wanting another and another. Remind yourself of your decision to quit.  Change habits associated with smoking. If you smoked while driving or when feeling stressed, try other activities to replace smoking. Stand up when drinking your coffee. Brush your  teeth after eating. Sit in a different chair when you read the paper. Avoid alcohol while trying to quit, and try to drink fewer caffeinated beverages. Alcohol and caffeine may urge you to smoke.  Avoid foods and drinks that can trigger a desire to smoke, such as sugary or spicy foods and alcohol.  Ask people who smoke not to smoke around you.  Have something planned to do right after eating or having a cup of coffee. Take a walk or exercise to perk you up. This will help to keep you from overeating.  Try a relaxation exercise to calm you down and decrease your stress. Remember, you may be tense and nervous for the first 2 weeks after you quit, but this will pass.  Find  new activities to keep your hands busy. Play with a pen, coin, or rubber band. Doodle or draw things on paper.  Brush your teeth right after eating. This will help cut down on the craving for the taste of tobacco after meals. You can try mouthwash, too.  Use oral substitutes, such as lemon drops, carrots, a cinnamon stick, or chewing gum, in place of cigarettes. Keep them handy so they are available when you have the urge to smoke.  When you have the urge to smoke, try deep breathing.  Designate your home as a nonsmoking area.  If you are a heavy smoker, ask your caregiver about a prescription for nicotine chewing gum. It can ease your withdrawal from nicotine.  Reward yourself. Set aside the cigarette money you save and buy yourself something nice.  Look for support from others. Join a support group or smoking cessation program. Ask someone at home or at work to help you with your plan to quit smoking.  Always ask yourself, "Do I need this cigarette or is this just a reflex?" Tell yourself, "Today, I choose not to smoke," or "I do not want to smoke." You are reminding yourself of your decision to quit, even if you do smoke a cigarette. HOW WILL I FEEL WHEN I QUIT SMOKING?  The benefits of not smoking start within days of  quitting.  You may have symptoms of withdrawal because your body is used to nicotine (the addictive substance in cigarettes). You may crave cigarettes, be irritable, feel very hungry, cough often, get headaches, or have difficulty concentrating.  The withdrawal symptoms are only temporary. They are strongest when you first quit but will go away within 10 to 14 days.  When withdrawal symptoms occur, stay in control. Think about your reasons for quitting. Remind yourself that these are signs that your body is healing and getting used to being without cigarettes.  Remember that withdrawal symptoms are easier to treat than the major diseases that smoking can cause.  Even after the withdrawal is over, expect periodic urges to smoke. However, these cravings are generally short-lived and will go away whether you smoke or not. Do not smoke!  If you relapse and smoke again, do not lose hope. Most smokers quit 3 times before they are successful.  If you relapse, do not give up! Plan ahead and think about what you will do the next time you get the urge to smoke. LIFE AS A NONSMOKER: MAKE IT FOR A MONTH, MAKE IT FOR LIFE Day 1: Hang this page where you will see it every day. Day 2: Get rid of all ashtrays, matches, and lighters. Day 3: Drink water. Breathe deeply between sips. Day 4: Avoid places with smoke-filled air, such as bars, clubs, or the smoking section of restaurants. Day 5: Keep track of how much money you save by not smoking. Day 6: Avoid boredom. Keep a good book with you or go to the movies. Day 7: Reward yourself! One week without smoking! Day 8: Make a dental appointment to get your teeth cleaned. Day 9: Decide how you will turn down a cigarette before it is offered to you. Day 10: Review your reasons for quitting. Day 11: Distract yourself. Stay active to keep your mind off smoking and to relieve tension. Take a walk, exercise, read a book, do a crossword puzzle, or try a new  hobby. Day 12: Exercise. Get off the bus before your stop or use stairs instead of escalators. Day 13:  Call on friends for support and encouragement. Day 14: Reward yourself! Two weeks without smoking! Day 15: Practice deep breathing exercises. Day 16: Bet a friend that you can stay a nonsmoker. Day 17: Ask to sit in nonsmoking sections of restaurants. Day 18: Hang up "No Smoking" signs. Day 19: Think of yourself as a nonsmoker. Day 20: Each morning, tell yourself you will not smoke. Day 21: Reward yourself! Three weeks without smoking! Day 22: Think of smoking in negative ways. Remember how it stains your teeth, gives you bad breath, and leaves you short of breath. Day 23: Eat a nutritious breakfast. Day 24:Do not relive your days as a smoker. Day 25: Hold a pencil in your hand when talking on the telephone. Day 26: Tell all your friends you do not smoke. Day 27: Think about how much better food tastes. Day 28: Remember, one cigarette is one too many. Day 29: Take up a hobby that will keep your hands busy. Day 30: Congratulations! One month without smoking! Give yourself a big reward. Your caregiver can direct you to community resources or hospitals for support, which may include:  Group support.  Education.  Hypnosis.  Subliminal therapy. Document Released: 08/13/2004 Document Revised: 02/07/2012 Document Reviewed: 05/03/2013 [facility] Patient Information 2014 [place], [place].

## 2013-11-28 ENCOUNTER — Other Ambulatory Visit: Payer: Self-pay | Admitting: Emergency Medicine

## 2013-11-28 DIAGNOSIS — S8990XA Unspecified injury of unspecified lower leg, initial encounter: Secondary | ICD-10-CM

## 2013-11-28 HISTORY — DX: Unspecified injury of unspecified lower leg, initial encounter: S89.90XA

## 2013-11-28 LAB — URINALYSIS, ROUTINE W REFLEX MICROSCOPIC
Glucose, UA: NEGATIVE mg/dL
Hgb urine dipstick: NEGATIVE
Ketones, ur: NEGATIVE mg/dL
Nitrite: NEGATIVE
Specific Gravity, Urine: 1.02 (ref 1.005–1.030)
Urobilinogen, UA: 0.2 mg/dL (ref 0.0–1.0)

## 2013-11-28 LAB — TESTOSTERONE: Testosterone: 495 ng/dL (ref 300–890)

## 2013-11-28 MED ORDER — DOXYCYCLINE HYCLATE 100 MG PO TABS: 100.0000 mg | ORAL_TABLET | Freq: Two times a day (BID) | ORAL | Status: DC

## 2013-11-28 NOTE — Progress Notes (Signed)
Subjective:    Patient ID: Tracy Barrett, male    DOB: [birth year], 37 y.o.   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 He did not tolerate RX cholesterol medications with increased aches. He has not been eating healthy or exercising routinely. He has been working long hours and he has a new baby at home. He has recently found out wife is pregnant again and wants to get a vasectomy.   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 He notes sometimes hard to fall asleep because he is thinking about everything he needs to get finished. He wants to D/C smoking but under too  Much stress to consider at this time.  He notes his right leg has started aching more with increased work hours. He also notes it seems to be retaining fluid again. He has had multiple debridements and fluid drawn out of posterior thigh by Ortho/ gen Careers adviser.    Current Outpatient Prescriptions on File Prior to Visit  Medication Sig Dispense Refill  . Magnesium 250 MG TABS Take by mouth 2 (two) times daily.      . Multiple Vitamins-Minerals (MULTIVITAMIN WITH MINERALS) tablet Take 1 tablet by mouth daily.      . Red Yeast Rice 600 MG CAPS Take by mouth 2 (two) times daily.       No current facility-administered medications on file prior to visit.   ALLERGIES Celexa; Pravastatin; Wellbutrin; and Zoloft  Past Medical History  Diagnosis Date  . Hyperlipidemia   . Vitamin D deficiency    Past Surgical History  Procedure Laterality Date  . Debridement leg Right     post fall from tree  . Ankle fracture surgery Right 1996   History  Substance Use Topics  . Smoking status: Current Every Day Smoker  . Smokeless tobacco: Not on file  . Alcohol Use: Not on file    Family History  Problem Relation Age of Onset  . Depression Father   . Stroke Father   . Diabetes Sister   . Cancer Paternal Uncle     Lung  . Heart disease Maternal Grandfather      Review of Systems  Constitutional: Positive for fatigue.  Respiratory:       CXR 11/05/11 WNL  Musculoskeletal: Positive for myalgias.       DR Turner Daniels PRN  Psychiatric/Behavioral: Positive for sleep disturbance and agitation. Negative for suicidal ideas. The patient is nervous/anxious.   All other systems reviewed and are negative.   BP 122/70  Pulse 74  Temp(Src) 98.2 F (36.8 C) (Temporal)  Resp 18  Ht 5' 7.25" (1.708 m)  Wt 194 lb (87.998 kg)  BMI 30.16 kg/m2     Objective:   Physical Exam  Nursing note and vitals reviewed. Constitutional: He is oriented to person, place, and time. He appears well-developed and well-nourished.  HENT:  Head: Normocephalic and atraumatic.  Right Ear: External ear normal.  Left Ear: External ear normal.  Nose: Nose normal.  Mouth/Throat: No oropharyngeal exudate.  Eyes: Conjunctivae and EOM are normal. Pupils are equal, round, and reactive to light. Right eye exhibits no discharge. Left eye exhibits no discharge. No scleral icterus.  Neck: Normal range of motion. Neck supple. No JVD present. No tracheal deviation present. No thyromegaly present.  Cardiovascular: Normal rate, regular rhythm, normal heart sounds  and intact distal pulses.   Pulmonary/Chest: Effort normal and breath sounds normal.  Abdominal: Soft. Bowel sounds are normal. He exhibits no distension and no mass. There is no tenderness. There is no rebound and no guarding.  Genitourinary:  Def til 2015  Musculoskeletal: Normal range of motion. He exhibits edema and tenderness.  Right posterior thigh with 2-3 inch area of fluctuant fullness at midline scar  Lymphadenopathy:    He has no cervical adenopathy.  Neurological: He is alert and oriented to person, place, and time. He has normal reflexes. No cranial nerve deficit. He exhibits normal muscle tone. Coordination normal.  Skin: Skin is warm and dry. No rash noted. No erythema.  No pallor.  Psychiatric: He has a normal mood and affect. His behavior is normal. Judgment and thought content normal.      EKG NSCSPT WNL    Assessment & Plan:  1. CPE and 3 month F/U  Cholesterol, D. Deficient. Needs healthy diet, cardio QD and obtain healthy weight. Check Labs, Check BP if >130/80 call office 2. Anxiety- Trial of Xanax .25 TID? Prn. Pt w/c if taking more than BID. Advised increase activity and trial of counseling. 3. Tobacco Dependence- Cessation info given, after holidays will try to D/C 4. Ref to Urology for Vasectomy consultation 5. Right leg trauma with multiple surgeries in past for debridement with recurrent edema and increased pain- Advised call Ortho for re-eval. Doxy 100 BID AD

## 2013-11-30 LAB — TB SKIN TEST
Induration: 0 mm
TB Skin Test: NEGATIVE

## 2013-12-03 ENCOUNTER — Telehealth: Payer: Self-pay

## 2013-12-03 NOTE — Telephone Encounter (Signed)
Message copied by Joya MartyrZMENT, Karisma Meiser M on Mon Dec 03, 2013 10:39 AM ------      Message from: Berenice PrimasSMITH, MELISSA R      Created: Sat Dec 01, 2013  7:37 PM       DID I send you a referral for him to go to Urology for vasectomy consult? Can you give him a call and let him know.      Thanks ------

## 2013-12-03 NOTE — Telephone Encounter (Signed)
Faxed referral to [facility] 11-30-13. Waiting for appt. Lmom to pt.

## 2013-12-05 NOTE — Progress Notes (Signed)
02/22/14 = 3 mth scheduled

## 2013-12-13 ENCOUNTER — Other Ambulatory Visit: Payer: Self-pay | Admitting: *Deleted

## 2013-12-13 DIAGNOSIS — Z1212 Encounter for screening for malignant neoplasm of rectum: Secondary | ICD-10-CM

## 2013-12-13 LAB — POC HEMOCCULT BLD/STL (HOME/3-CARD/SCREEN)
Card #2 Fecal Occult Blod, POC: NEGATIVE
Card #3 Fecal Occult Blood, POC: NEGATIVE
FECAL OCCULT BLD: NEGATIVE

## 2014-02-22 ENCOUNTER — Ambulatory Visit (INDEPENDENT_AMBULATORY_CARE_PROVIDER_SITE_OTHER): Payer: 59 | Admitting: Emergency Medicine

## 2014-02-22 VITALS — BP 126/82 | HR 80 | Temp 99.0°F | Resp 16 | Wt 194.2 lb

## 2014-02-22 DIAGNOSIS — F17200 Nicotine dependence, unspecified, uncomplicated: Secondary | ICD-10-CM

## 2014-02-22 DIAGNOSIS — Z79899 Other long term (current) drug therapy: Secondary | ICD-10-CM

## 2014-02-22 DIAGNOSIS — E782 Mixed hyperlipidemia: Secondary | ICD-10-CM

## 2014-02-22 DIAGNOSIS — R7309 Other abnormal glucose: Secondary | ICD-10-CM

## 2014-02-22 LAB — CBC WITH DIFFERENTIAL/PLATELET
BASOS ABS: 0 10*3/uL (ref 0.0–0.1)
BASOS PCT: 0 % (ref 0–1)
Eosinophils Absolute: 0.2 10*3/uL (ref 0.0–0.7)
Eosinophils Relative: 2 % (ref 0–5)
HCT: 42.3 % (ref 39.0–52.0)
HEMOGLOBIN: 14.3 g/dL (ref 13.0–17.0)
LYMPHS PCT: 33 % (ref 12–46)
Lymphs Abs: 3.1 10*3/uL (ref 0.7–4.0)
MCH: 30.8 pg (ref 26.0–34.0)
MCHC: 33.8 g/dL (ref 30.0–36.0)
MCV: 91 fL (ref 78.0–100.0)
Monocytes Absolute: 1 10*3/uL (ref 0.1–1.0)
Monocytes Relative: 11 % (ref 3–12)
NEUTROS PCT: 54 % (ref 43–77)
Neutro Abs: 5 10*3/uL (ref 1.7–7.7)
Platelets: 208 10*3/uL (ref 150–400)
RBC: 4.65 MIL/uL (ref 4.22–5.81)
RDW: 13.8 % (ref 11.5–15.5)
WBC: 9.3 10*3/uL (ref 4.0–10.5)

## 2014-02-22 LAB — HEMOGLOBIN A1C
HEMOGLOBIN A1C: 5.8 % — AB (ref <5.7)
MEAN PLASMA GLUCOSE: 120 mg/dL — AB (ref <117)

## 2014-02-22 NOTE — Patient Instructions (Signed)
Diabetes, Type 2, Am I At Risk? Diabetes is a lasting (chronic) disease. In type 2 diabetes, the pancreas does not make enough insulin, and the body does not respond normally to the insulin that is made. This type of diabetes was also previously called adult onset diabetes. About 90% of all those who have diabetes have type 2. It usually occurs after the age of 34, but can occur at any age.  People develop type 2 diabetes because they do not use insulin properly. Eventually, the pancreas cannot make enough insulin for the body's needs. Over time, the amount of glucose (sugar) in the blood increases. RISK FACTORS  Overweight  the more weight you have, the more resistant your cells become to insulin.  Family history  you are more likely to get diabetes if a parent or sibling has diabetes.  Race certain races get diabetes more.  African Americans.  American Indians.  Asian Americans.  Hispanics.  Pacific Islander.  Inactive exercise helps control weight and helps your cells be more sensitive to insulin.  Gestational diabetes  some women develop diabetes while they are pregnant. This goes away when they deliver. However, they are 50-60% more likely to develop type 2 diabetes at a later time.  Having a baby over 9 pounds  a sign that you may have had gestational diabetes.  Age the risk of diabetes goes up as you get older, especially after age 18.  High blood pressure (hypertension). SYMPTOMS Many people have no signs or symptoms. Symptoms can be so mild that you might not even notice them. Some of these signs are:  Increased thirst.  Increased hunger.  Tiredness (fatigue).  Increased urination, especially at night.  Weight loss.  Blurred vision.  Sores that do not heal. WHO SHOULD BE TESTED?  Anyone 25 years or older, especially if overweight, should consider getting tested.  If you are younger than 56, overweight, and have one or more of the risk factors, you should  consider getting tested. DIAGNOSIS  Fasting blood glucose (FBS). Usually, 2 are done.  FBS 101-125 mg/dl is considered pre-diabetes.  FBS 126 mg/dl or greater is considered diabetes.  2 hour Oral Glucose Tolerance Test (OGTT). This test is preformed by first having you not eat or drink for several hours. You are then given something sweet to drink and your blood glucose is measured fasting, at one hour and 2 hours. This test tells how well you are able to handle sugars or carbohydrates.  Fasting: 60-100 mg/dl.  1 hour: less than 200 mg/dl.  2 hours: less than 140 mg/dl.  A1c A1c is a blood glucose test that gives and average of your blood glucose over 3 months. It is the accepted method to use to diagnose diabetes.  A1c 5.7-6.4% is considered pre-diabetes.  A1c 6.5% or greater is considered diabetes. WHAT DOES IT MEAN TO HAVE PRE-DIABETES? Pre-diabetes means you are at risk for getting type 2 diabetes. Your blood glucose is higher than normal, but not yet high enough to diagnose diabetes. The good news is, if you have pre-diabetes you can reduce the risk of getting diabetes and even return to normal blood glucose levels. With modest weight loss and moderate physical activity, you can delay or prevent type 2 diabetes.  PREVENTION You cannot do anything about race, age or family history, but you can lower your chances of getting diabetes. You can:   Exercise regularly and be active.  Reduce fat and calorie intake.  Make wise food  choices as much as you can.  Reduce your intake of salt and alcohol.  Maintain a reasonable weight.  Keep blood pressure in an acceptable range. Take medication if needed.  Not smoke.  Maintain an acceptable cholesterol level (HDL, LDL, Triglycerides). Take medication if needed. DOING MY PART: GETTING STARTED Making big changes in your life is hard, especially if you are faced with more than one change. You can make it easier by taking these  steps:  Make a plan to change behavior.  Decide exactly what you will do and when you will do it.  Plan what you need to get ready.  Think about what might prevent you from reaching your goals.  Find family and friends who will support and encourage you.  Decide how you will reward yourself when you do what you have planned.  Your doctor, dietitian, or counselor can help you make a plan. HERE ARE SOME OF THE AREAS YOU MAY WISH TO CHANGE TO REDUCE YOUR RISK OF DIABETES. If you are overweight or obese, choose sensible ways to get in shape. Even small amounts of weight loss, like 5-10 pounds, can help reduce the effects of insulin resistance and help blood glucose control. Diet  Avoid crash diets. Instead, eat less of the foods you usually have. Limit the amount of fat you eat.  Increase your physical activity. Aim for at least 30 minutes of exercise most days of the week.  Set a reasonable weight-loss goal, such as losing 1 pound a week. Aim for a long-term goal of losing 5-7% of your total body weight.  Make wise food choices most of the time.  What you eat has a big impact on your health. By making wise food choices, you can help control your body weight, blood pressure, and cholesterol.  Take a hard look at the serving sizes of the foods you eat. Reduce serving sizes of meat, desserts, and foods high in fat. Increase your intake of fruits and vegetables.  Limit your fat intake to about 25% of your total calories. For example, if your food choices add up to about 2,000 calories a day, try to eat no more than 56 grams of fat. Your caregiver or a dietitian can help you figure out how much fat to have. You can check food labels for fat content too.  You may also want to reduce the number of calories you have each day.  Keep a food log. Write down what you eat, how much you eat, and anything else that helps keep you on track.  When you meet your goal, reward yourself with a nonfood  item or activity. Exercise  Be physically active every day.  Keep and exercise log. Write down what exercise you did, for how long, and anything else that keeps you on track.  Regular exercise (like brisk walking) tackles several risk factors at once. It helps you lose weight, it keeps your cholesterol and blood pressure under control, and it helps your body use insulin. People who are physically active for 30 minutes a day, 5 days a week, reduced their risk of type 2 diabetes. If you are not very active, you should start slowly at first. Talk with your caregiver first about what kinds of exercise would be safe for you. Make a plan to increase your activity level with the goal of being active for at least 30 minutes a day, most days of the week.  Choose activities you enjoy. Here are some ways to  work extra activity into your daily routine:  Take the stairs rather than an elevator or escalator.  Park at the far end of the lot and walk.  Get off the bus a few stops early and walk the rest of the way.  Walk or bicycle instead of drive whenever you can. Medications Some people need medication to help control their blood pressure or cholesterol levels. If you do, take your medicines as directed. Ask your caregiver whether there are any medicines you can take to prevent type 2 diabetes. Document Released: 11/18/2003 Document Revised: 02/07/2012 Document Reviewed: 08/13/2009 [facility] Patient Information 2014 [employer], [place]. Cholesterol Cholesterol is a type of fat. Your body needs a small amount of cholesterol, but too much can cause health problems. Certain problems include heart attacks, strokes, and not enough blood flow to your heart, brain, kidneys, or feet. You get cholesterol in 2 ways:  Naturally.  By eating certain foods. HOME CARE  Eat a low-fat diet:  Eat less eggs, whole dairy products (whole milk, cheese, and butter), fatty meats, and fried foods.  Eat more fruits,  vegetables, whole-wheat breads, lean chicken, and fish.  Follow your exercise program as told by your doctor.  Keep your weight at a healthy level. Talk to your doctor about what is right for you.  Only take medicine as told by your doctor.  Get your cholesterol checked once a year or as told by your doctor. MAKE SURE YOU:  Understand these instructions.  Will watch your condition.  Will get help right away if you are not doing well or get worse. Document Released: 02/11/2009 Document Revised: 02/07/2012 Document Reviewed: 08/29/2013 [facility] Patient Information 2014 [place], [place].

## 2014-02-22 NOTE — Progress Notes (Signed)
Subjective:    Patient ID: Tracy Barrett, male    DOB: 08/08/1976, 38 y.o.   MRN: [id number]  HPI Comments: 38 yo male presents for 3 month F/U for Cholesterol, Pre-Dm, D. Deficient. He is exercising with work. He is eating healthy for the most part. He has decreased red meats. He prefers to stay off Cholesterol RX due to myalgias. He has been doing well on Wellbutrin but did not like the way Xanax made him feel. He notes his mood is doing well overall and is less agitated.  LAST LABS CHOL         235   11/27/2013 HDL           39   11/27/2013 LDLCALC      156   11/27/2013 TRIG         200   11/27/2013 CHOLHDL      6.0   11/27/2013 ALT           20   11/27/2013 AST           18   11/27/2013 ALKPHOS       67   11/27/2013 BILITOT      0.5   11/27/2013 WBC     10.8   11/27/2013 HGB     15.4   11/27/2013 HCT     44.3   11/27/2013 MCV     88.8   11/27/2013 PLT      217   11/27/2013 HGBA1C      5.9   11/27/2013 CREATININE     0.92   11/27/2013 BUN              12   11/27/2013 NA              139   11/27/2013 K               4.7   11/27/2013 CL              102   11/27/2013 CO2              29   11/27/2013   Current Outpatient Prescriptions on File Prior to Visit  Medication Sig Dispense Refill  . buPROPion (WELLBUTRIN XL) 150 MG 24 hr tablet Take 150 mg by mouth daily.      . Magnesium 250 MG TABS Take by mouth 2 (two) times daily.      . Multiple Vitamins-Minerals (MULTIVITAMIN WITH MINERALS) tablet Take 1 tablet by mouth daily.      . Red Yeast Rice 600 MG CAPS Take 1,200 mg by mouth daily.       Marland Kitchen. doxycycline (VIBRA-TABS) 100 MG tablet Take 1 tablet (100 mg total) by mouth 2 (two) times daily.  20 tablet  0   No current facility-administered medications on file prior to visit.      Allergies  Allergen Reactions  . Celexa [Citalopram Hydrobromide]     No relief  . Pravastatin     Myalgias  . Wellbutrin [Bupropion]     No relief  . Zoloft [Sertraline Hcl]    aggitation      Past Medical History  Diagnosis Date  . Hyperlipidemia   . Vitamin D deficiency     Review of Systems  All other systems reviewed and are negative.   BP 126/82  Pulse 80  Temp(Src) 99 F (37.2 C)  Resp 16  Wt 194 lb 3.2 oz (88.089 kg)  Objective:   Physical Exam  Nursing note and vitals reviewed. Constitutional: He is oriented to person, place, and time. He appears well-developed and well-nourished.  HENT:  Head: Normocephalic and atraumatic.  Right Ear: External ear normal.  Left Ear: External ear normal.  Nose: Nose normal.  Eyes: Conjunctivae and EOM are normal.  Neck: Normal range of motion. Neck supple. No JVD present. No thyromegaly present.  Cardiovascular: Normal rate, regular rhythm, normal heart sounds and intact distal pulses.   Pulmonary/Chest: Effort normal and breath sounds normal.  Abdominal: Soft. Bowel sounds are normal. He exhibits no distension and no mass. There is no tenderness. There is no rebound and no guarding.  Musculoskeletal: Normal range of motion. He exhibits no edema and no tenderness.  Lymphadenopathy:    He has no cervical adenopathy.  Neurological: He is alert and oriented to person, place, and time. He has normal reflexes. No cranial nerve deficit. Coordination normal.  Skin: Skin is warm and dry.  Psychiatric: He has a normal mood and affect. His behavior is normal. Judgment and thought content normal.          Assessment & Plan:  1. Elevated BS/ Cholesterol- recheck labs, Need to eat healthier and exercise AD.  2. Tobacco dependence- cessation discussed

## 2014-02-23 LAB — LIPID PANEL
Cholesterol: 177 mg/dL (ref 0–200)
HDL: 41 mg/dL (ref >39)
LDL Cholesterol: 119 mg/dL — ABNORMAL HIGH (ref 0–99)
Total CHOL/HDL Ratio: 4.3 Ratio
Triglycerides: 86 mg/dL (ref <150)
VLDL: 17 mg/dL (ref 0–40)

## 2014-02-23 LAB — BASIC METABOLIC PANEL WITH GFR
BUN: 20 mg/dL (ref 6–23)
CALCIUM: 9.2 mg/dL (ref 8.4–10.5)
CHLORIDE: 105 meq/L (ref 96–112)
CO2: 26 mEq/L (ref 19–32)
Creat: 0.9 mg/dL (ref 0.50–1.35)
GFR, Est African American: >89 mL/min
GFR, Est Non African American: >89 mL/min
GLUCOSE: 75 mg/dL (ref 70–99)
Potassium: 4.5 mEq/L (ref 3.5–5.3)
SODIUM: 138 meq/L (ref 135–145)

## 2014-02-23 LAB — HEPATIC FUNCTION PANEL
ALBUMIN: 4.1 g/dL (ref 3.5–5.2)
ALT: 17 U/L (ref 0–53)
AST: 19 U/L (ref 0–37)
Alkaline Phosphatase: 61 U/L (ref 39–117)
BILIRUBIN INDIRECT: 0.4 mg/dL (ref 0.2–1.2)
Bilirubin, Direct: 0.1 mg/dL (ref 0.0–0.3)
TOTAL PROTEIN: 6.6 g/dL (ref 6.0–8.3)
Total Bilirubin: 0.5 mg/dL (ref 0.2–1.2)

## 2014-02-24 ENCOUNTER — Encounter: Payer: Self-pay | Admitting: Emergency Medicine

## 2014-05-22 ENCOUNTER — Encounter: Payer: Self-pay | Admitting: Emergency Medicine

## 2014-05-22 ENCOUNTER — Ambulatory Visit
Admission: RE | Admit: 2014-05-22 | Discharge: 2014-05-22 | Disposition: A | Discharge status: Home / Self Care | Payer: 59 | Source: Ambulatory Visit | Attending: Emergency Medicine | Admitting: Emergency Medicine

## 2014-05-22 ENCOUNTER — Encounter (INDEPENDENT_AMBULATORY_CARE_PROVIDER_SITE_OTHER): Payer: Self-pay

## 2014-05-22 ENCOUNTER — Ambulatory Visit (INDEPENDENT_AMBULATORY_CARE_PROVIDER_SITE_OTHER): Payer: 59 | Admitting: Emergency Medicine

## 2014-05-22 VITALS — BP 134/78 | HR 74 | Temp 98.4°F | Resp 18 | Ht 69.0 in | Wt 186.0 lb

## 2014-05-22 DIAGNOSIS — R05 Cough: Secondary | ICD-10-CM

## 2014-05-22 DIAGNOSIS — R059 Cough, unspecified: Secondary | ICD-10-CM

## 2014-05-22 DIAGNOSIS — R1013 Epigastric pain: Secondary | ICD-10-CM

## 2014-05-22 LAB — CBC WITH DIFFERENTIAL/PLATELET
Basophils Absolute: 0 10*3/uL (ref 0.0–0.1)
Basophils Relative: 0 % (ref 0–1)
EOS PCT: 3 % (ref 0–5)
Eosinophils Absolute: 0.3 10*3/uL (ref 0.0–0.7)
HEMATOCRIT: 43.3 % (ref 39.0–52.0)
Hemoglobin: 14.7 g/dL (ref 13.0–17.0)
LYMPHS ABS: 2.8 10*3/uL (ref 0.7–4.0)
Lymphocytes Relative: 29 % (ref 12–46)
MCH: 31.1 pg (ref 26.0–34.0)
MCHC: 33.9 g/dL (ref 30.0–36.0)
MCV: 91.5 fL (ref 78.0–100.0)
MONO ABS: 0.8 10*3/uL (ref 0.1–1.0)
Monocytes Relative: 8 % (ref 3–12)
Neutro Abs: 5.9 10*3/uL (ref 1.7–7.7)
Neutrophils Relative %: 60 % (ref 43–77)
Platelets: 235 10*3/uL (ref 150–400)
RBC: 4.73 MIL/uL (ref 4.22–5.81)
RDW: 13.2 % (ref 11.5–15.5)
WBC: 9.8 10*3/uL (ref 4.0–10.5)

## 2014-05-22 LAB — HEPATIC FUNCTION PANEL
ALK PHOS: 70 U/L (ref 39–117)
ALT: 19 U/L (ref 0–53)
AST: 18 U/L (ref 0–37)
Albumin: 4.4 g/dL (ref 3.5–5.2)
BILIRUBIN DIRECT: 0.1 mg/dL (ref 0.0–0.3)
BILIRUBIN TOTAL: 0.6 mg/dL (ref 0.2–1.2)
Indirect Bilirubin: 0.5 mg/dL (ref 0.2–1.2)
Total Protein: 6.9 g/dL (ref 6.0–8.3)

## 2014-05-22 LAB — BASIC METABOLIC PANEL WITH GFR
BUN: 14 mg/dL (ref 6–23)
CALCIUM: 9.5 mg/dL (ref 8.4–10.5)
CO2: 26 meq/L (ref 19–32)
CREATININE: 0.98 mg/dL (ref 0.50–1.35)
Chloride: 106 mEq/L (ref 96–112)
GFR, Est African American: >89 mL/min
GFR, Est Non African American: >89 mL/min
GLUCOSE: 93 mg/dL (ref 70–99)
Potassium: 4.3 mEq/L (ref 3.5–5.3)
SODIUM: 141 meq/L (ref 135–145)

## 2014-05-22 LAB — AMYLASE: Amylase: 26 U/L (ref 0–105)

## 2014-05-22 LAB — LIPASE: Lipase: <10 U/L (ref 0–75)

## 2014-05-22 NOTE — Patient Instructions (Signed)
Food Choices for Gastroesophageal Reflux Disease  When you have gastroesophageal reflux disease (GERD), the foods you eat and your eating habits are very important. Choosing the right foods can help ease your discomfort.   WHAT GUIDELINES DO I NEED TO FOLLOW?   · Choose fruits, vegetables, whole grains, and low-fat dairy products.    · Choose low-fat meat, fish, and poultry.  · Limit fats such as oils, salad dressings, butter, nuts, and avocado.    · Keep a food diary. This helps you identify foods that cause symptoms.    · Avoid foods that cause symptoms. These may be different for everyone.    · Eat small meals often instead of 3 large meals a day.    · Eat your meals slowly, in a place where you are relaxed.    · Limit fried foods.    · Cook foods using methods other than frying.    · Avoid drinking alcohol.    · Avoid drinking large amounts of liquids with your meals.    · Avoid bending over or lying down until 2-3 hours after eating.    WHAT FOODS ARE NOT RECOMMENDED?   These are some foods and drinks that may make your symptoms worse:  Vegetables  Tomatoes. Tomato juice. Tomato and spaghetti sauce. Chili peppers. Onion and garlic. Horseradish.  Fruits  Oranges, grapefruit, and lemon (fruit and juice).  Meats  High-fat meats, fish, and poultry. This includes hot dogs, ribs, ham, sausage, salami, and bacon.  Dairy  Whole milk and chocolate milk. Sour cream. Cream. Butter. Ice cream. Cream cheese.   Drinks  Coffee and tea. Bubbly (carbonated) drinks or energy drinks.  Condiments  Hot sauce. Barbecue sauce.   Sweets/Desserts  Chocolate and cocoa. Donuts. Peppermint and spearmint.  Fats and Oils  High-fat foods. This includes French fries and potato chips.  Other  Vinegar. Strong spices. This includes black pepper, white pepper, red pepper, cayenne, curry powder, cloves, ginger, and chili powder.  The items listed above may not be a complete list of foods and drinks to avoid. Contact your dietitian for more  information.  Document Released: 05/16/2012 Document Revised: 11/20/2013 Document Reviewed: 09/19/2013  ExitCare® Patient Information ©2015 [employer]. This information is not intended to replace advice given to you by your health care provider. Make sure you discuss any questions you have with your health care provider.

## 2014-05-22 NOTE — Progress Notes (Signed)
Subjective:    Patient ID: Tracy Barrett, male    DOB: 1976-02-05, 38 y.o.   MRN: [id number]  HPI Comments: 38 yo WM with sharp epigastric pains on/ off x 3 days. He denies diet change or new exposures except working more in the heat and dust/ asbestos. He denies strain or hernia history. He is under more stress. He has not had any ETOH. He has mild dry cough but no other lung symptoms. He rarely has reflux/ heartburn. WBC             9.3   02/22/2014 HGB            14.3   02/22/2014 HCT            42.3   02/22/2014 PLT             208   02/22/2014 GLUCOSE          75   02/22/2014 CHOL            177   02/22/2014 TRIG             86   02/22/2014 HDL              41   02/22/2014 LDLCALC         119   02/22/2014 ALT              17   02/22/2014 AST              19   02/22/2014 NA              138   02/22/2014 K               4.5   02/22/2014 CL              105   02/22/2014 CREATININE     0.90   02/22/2014 BUN              20   02/22/2014 CO2              26   02/22/2014 TSH           3.352   11/27/2013 PSA            0.47   11/27/2013 INR            1.07   10/12/2010 HGBA1C          5.8   02/22/2014   Abdominal Pain      Medication List       This list is accurate as of: 05/22/14 12:12 PM.  Always use your most recent med list.               buPROPion 150 MG 24 hr tablet  Commonly known as:  WELLBUTRIN XL  Take 150 mg by mouth daily.     Fish Oil 1000 MG Caps  Take by mouth daily.     Magnesium 250 MG Tabs  Take by mouth 2 (two) times daily.     multivitamin with minerals tablet  Take 1 tablet by mouth daily.     Red Yeast Rice 600 MG Caps  Take 1,200 mg by mouth daily.        Allergies  Allergen Reactions  . Celexa [Citalopram Hydrobromide]     No relief  . Pravastatin     Myalgias  . Wellbutrin [Bupropion]     No relief  . Zoloft [Sertraline  Hcl]     aggitation   Past Medical History  Diagnosis Date  . Hyperlipidemia   . Vitamin D deficiency      Review  of Systems  Respiratory: Positive for cough.   Gastrointestinal: Positive for abdominal pain.  All other systems reviewed and are negative.  BP 134/78  Pulse 74  Temp(Src) 98.4 F (36.9 C) (Temporal)  Resp 18  Ht 5\' 9"  (1.753 m)  Wt 186 lb (84.369 kg)  BMI 27.45 kg/m2     Objective:   Physical Exam  Nursing note and vitals reviewed. Constitutional: He is oriented to person, place, and time. He appears well-developed and well-nourished.  HENT:  Head: Normocephalic and atraumatic.  Right Ear: External ear normal.  Left Ear: External ear normal.  Nose: Nose normal.  Mouth/Throat: Oropharynx is clear and moist. No oropharyngeal exudate.  Eyes: Conjunctivae are normal.  Neck: Normal range of motion. Neck supple. No thyromegaly present.  Cardiovascular: Normal rate, regular rhythm, normal heart sounds and intact distal pulses.   Pulmonary/Chest: Effort normal and breath sounds normal.  Abdominal: Soft. Bowel sounds are normal. He exhibits no distension and no mass. There is tenderness. There is no rebound and no guarding.  epigastric  Musculoskeletal: Normal range of motion.  Lymphadenopathy:    He has no cervical adenopathy.  Neurological: He is alert and oriented to person, place, and time.  Skin: Skin is warm and dry.  Psychiatric: He has a normal mood and affect. Judgment normal.          Assessment & Plan:  1. EPigastric Pain- Check labs, GERD- Diet/ hygiene discussed, May try Dexilant Sx#14  and call with results get CXR if all NEG get abdomen U/S  2. Elevated BP w/o DX of HTN- Check BP call if >130/80, increase cardio

## 2014-05-23 ENCOUNTER — Other Ambulatory Visit: Payer: Self-pay | Admitting: Emergency Medicine

## 2014-05-23 DIAGNOSIS — R05 Cough: Secondary | ICD-10-CM

## 2014-05-23 DIAGNOSIS — R059 Cough, unspecified: Secondary | ICD-10-CM

## 2014-05-23 MED ORDER — ALBUTEROL SULFATE HFA 108 (90 BASE) MCG/ACT IN AERS: 2.0000 | INHALATION_SPRAY | Freq: Four times a day (QID) | RESPIRATORY_TRACT | Status: DC | PRN

## 2014-05-29 ENCOUNTER — Telehealth: Payer: Self-pay | Admitting: *Deleted

## 2014-05-29 ENCOUNTER — Other Ambulatory Visit: Payer: Self-pay | Admitting: *Deleted

## 2014-05-29 MED ORDER — BUPROPION HCL ER (XL) 150 MG PO TB24: 150.0000 mg | ORAL_TABLET | Freq: Every day | ORAL | Status: DC

## 2014-05-29 NOTE — Telephone Encounter (Signed)
Sent RX Wellbutrin 150 mg XL by faxed and electronic.

## 2014-06-14 ENCOUNTER — Ambulatory Visit (INDEPENDENT_AMBULATORY_CARE_PROVIDER_SITE_OTHER): Payer: 59 | Admitting: Emergency Medicine

## 2014-06-14 ENCOUNTER — Encounter: Payer: Self-pay | Admitting: Emergency Medicine

## 2014-06-14 VITALS — BP 118/68 | HR 78 | Temp 98.4°F | Resp 18 | Ht 69.0 in | Wt 189.0 lb

## 2014-06-14 DIAGNOSIS — E782 Mixed hyperlipidemia: Secondary | ICD-10-CM

## 2014-06-14 DIAGNOSIS — R7309 Other abnormal glucose: Secondary | ICD-10-CM

## 2014-06-14 LAB — LIPID PANEL
Cholesterol: 231 mg/dL — ABNORMAL HIGH (ref 0–200)
HDL: 36 mg/dL — AB (ref >39)
LDL CALC: 135 mg/dL — AB (ref 0–99)
Total CHOL/HDL Ratio: 6.4 Ratio
Triglycerides: 299 mg/dL — ABNORMAL HIGH (ref <150)
VLDL: 60 mg/dL — ABNORMAL HIGH (ref 0–40)

## 2014-06-14 NOTE — Progress Notes (Signed)
Subjective:    Patient ID: Tracy Barrett, male    DOB: 1976/11/11, 38 y.o.   MRN: [id number]  HPI Comments: 38 yo WM for cholesterol/ A1c recheck. He is doing well overall. He is trying to eat better and keep active.   WBC             9.8   05/22/2014 HGB            14.7   05/22/2014 HCT            43.3   05/22/2014 PLT             235   05/22/2014 GLUCOSE          93   05/22/2014 CHOL            177   02/22/2014 TRIG             86   02/22/2014 HDL              41   02/22/2014 LDLCALC         119   02/22/2014 ALT              19   05/22/2014 AST              18   05/22/2014 NA              141   05/22/2014 K               4.3   05/22/2014 CL              106   05/22/2014 CREATININE     0.98   05/22/2014 BUN              14   05/22/2014 CO2              26   05/22/2014 TSH           3.352   11/27/2013 PSA            0.47   11/27/2013 INR            1.07   10/12/2010 HGBA1C          5.8   02/22/2014   Hyperlipidemia      Medication List       This list is accurate as of: 06/14/14 10:42 AM.  Always use your most recent med list.               buPROPion 150 MG 24 hr tablet  Commonly known as:  WELLBUTRIN XL  Take 1 tablet (150 mg total) by mouth daily.     Fish Oil 1000 MG Caps  Take by mouth daily.     Magnesium 250 MG Tabs  Take by mouth 2 (two) times daily.     multivitamin with minerals tablet  Take 1 tablet by mouth daily.     Red Yeast Rice 600 MG Caps  Take 1,200 mg by mouth daily.       Allergies  Allergen Reactions  . Celexa [Citalopram Hydrobromide]     No relief  . Pravastatin     Myalgias  . Wellbutrin [Bupropion]     No relief  . Zoloft [Sertraline Hcl]     aggitation   Past Medical History  Diagnosis Date  . Hyperlipidemia   . Vitamin D deficiency      Review of Systems  All other systems reviewed  and are negative.  BP 118/68  Pulse 78  Temp(Src) 98.4 F (36.9 C) (Temporal)  Resp 18  Ht 5\' 9"  (1.753 m)  Wt 189 lb (85.73 kg)  BMI 27.90  kg/m2     Objective:   Physical Exam  Nursing note and vitals reviewed. Constitutional: He is oriented to person, place, and time. He appears well-developed and well-nourished.  HENT:  Head: Normocephalic and atraumatic.  Right Ear: External ear normal.  Left Ear: External ear normal.  Nose: Nose normal.  Eyes: Conjunctivae are normal.  Neck: Normal range of motion.  Cardiovascular: Normal rate, regular rhythm, normal heart sounds and intact distal pulses.   Pulmonary/Chest: Effort normal and breath sounds normal.  Abdominal: Soft.  Musculoskeletal: Normal range of motion.  Lymphadenopathy:    He has no cervical adenopathy.  Neurological: He is alert and oriented to person, place, and time.  Skin: Skin is warm and dry.  Psychiatric: He has a normal mood and affect. Judgment normal.          Assessment & Plan:  Cholesterol/ elevated BS- recheck labs, Need to eat healthier and exercise AD.

## 2014-06-15 LAB — HEMOGLOBIN A1C
Hgb A1c MFr Bld: 5.9 % — ABNORMAL HIGH (ref <5.7)
Mean Plasma Glucose: 123 mg/dL — ABNORMAL HIGH (ref <117)

## 2014-10-01 ENCOUNTER — Other Ambulatory Visit: Payer: Self-pay

## 2014-10-01 MED ORDER — BUPROPION HCL ER (XL) 150 MG PO TB24: 150.0000 mg | ORAL_TABLET | Freq: Every day | ORAL | Status: DC

## 2014-11-27 ENCOUNTER — Encounter: Payer: Self-pay | Admitting: Emergency Medicine

## 2014-12-10 IMAGING — CR DG CHEST 2V
2 series · 2 of 2 positions shown · non-contrast
Comparison: Chest x-ray of 11/05/2011

CLINICAL DATA: Intermittent pain, cough,  smoking history

EXAM:
CHEST  2 VIEW

[w chest pa]
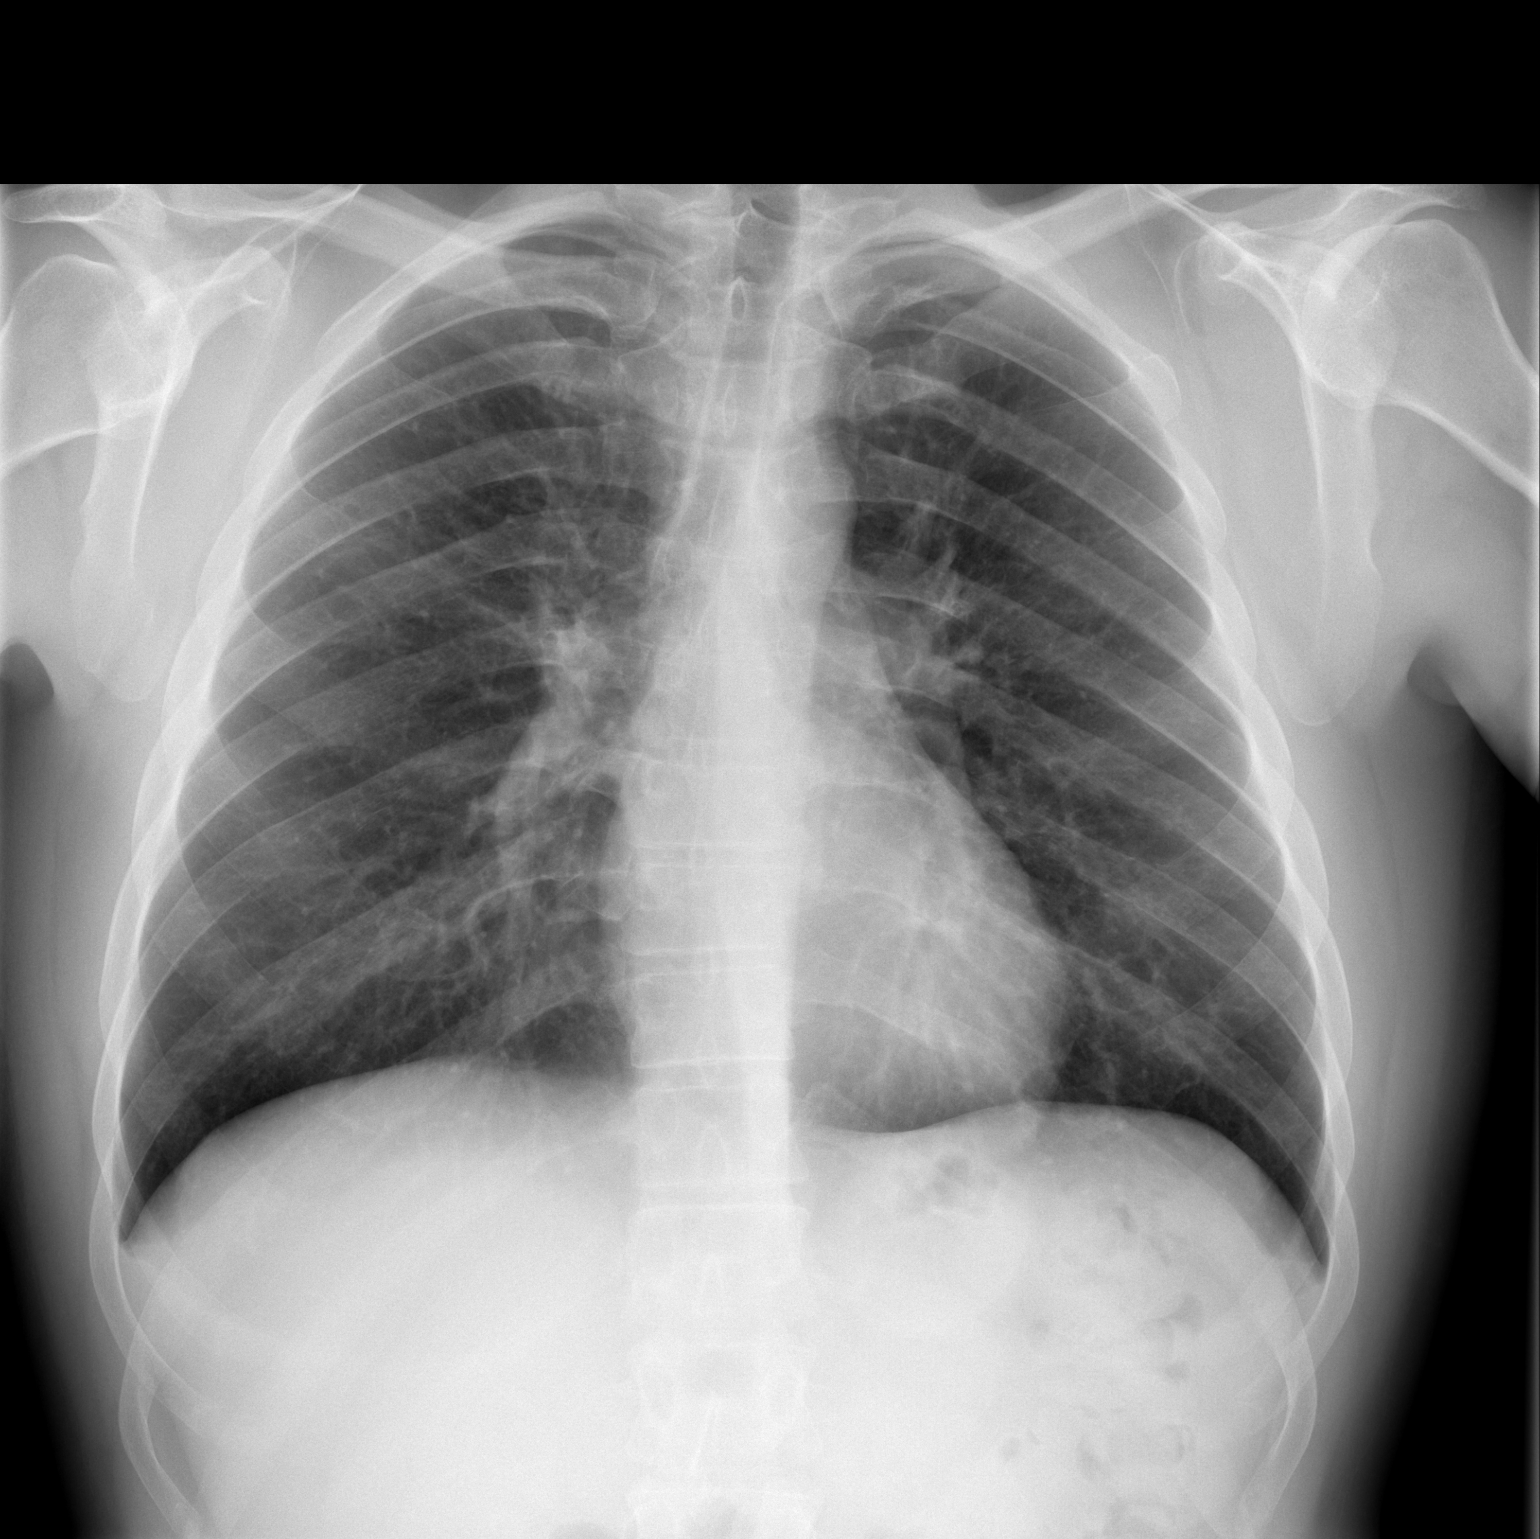

[w chest lat]
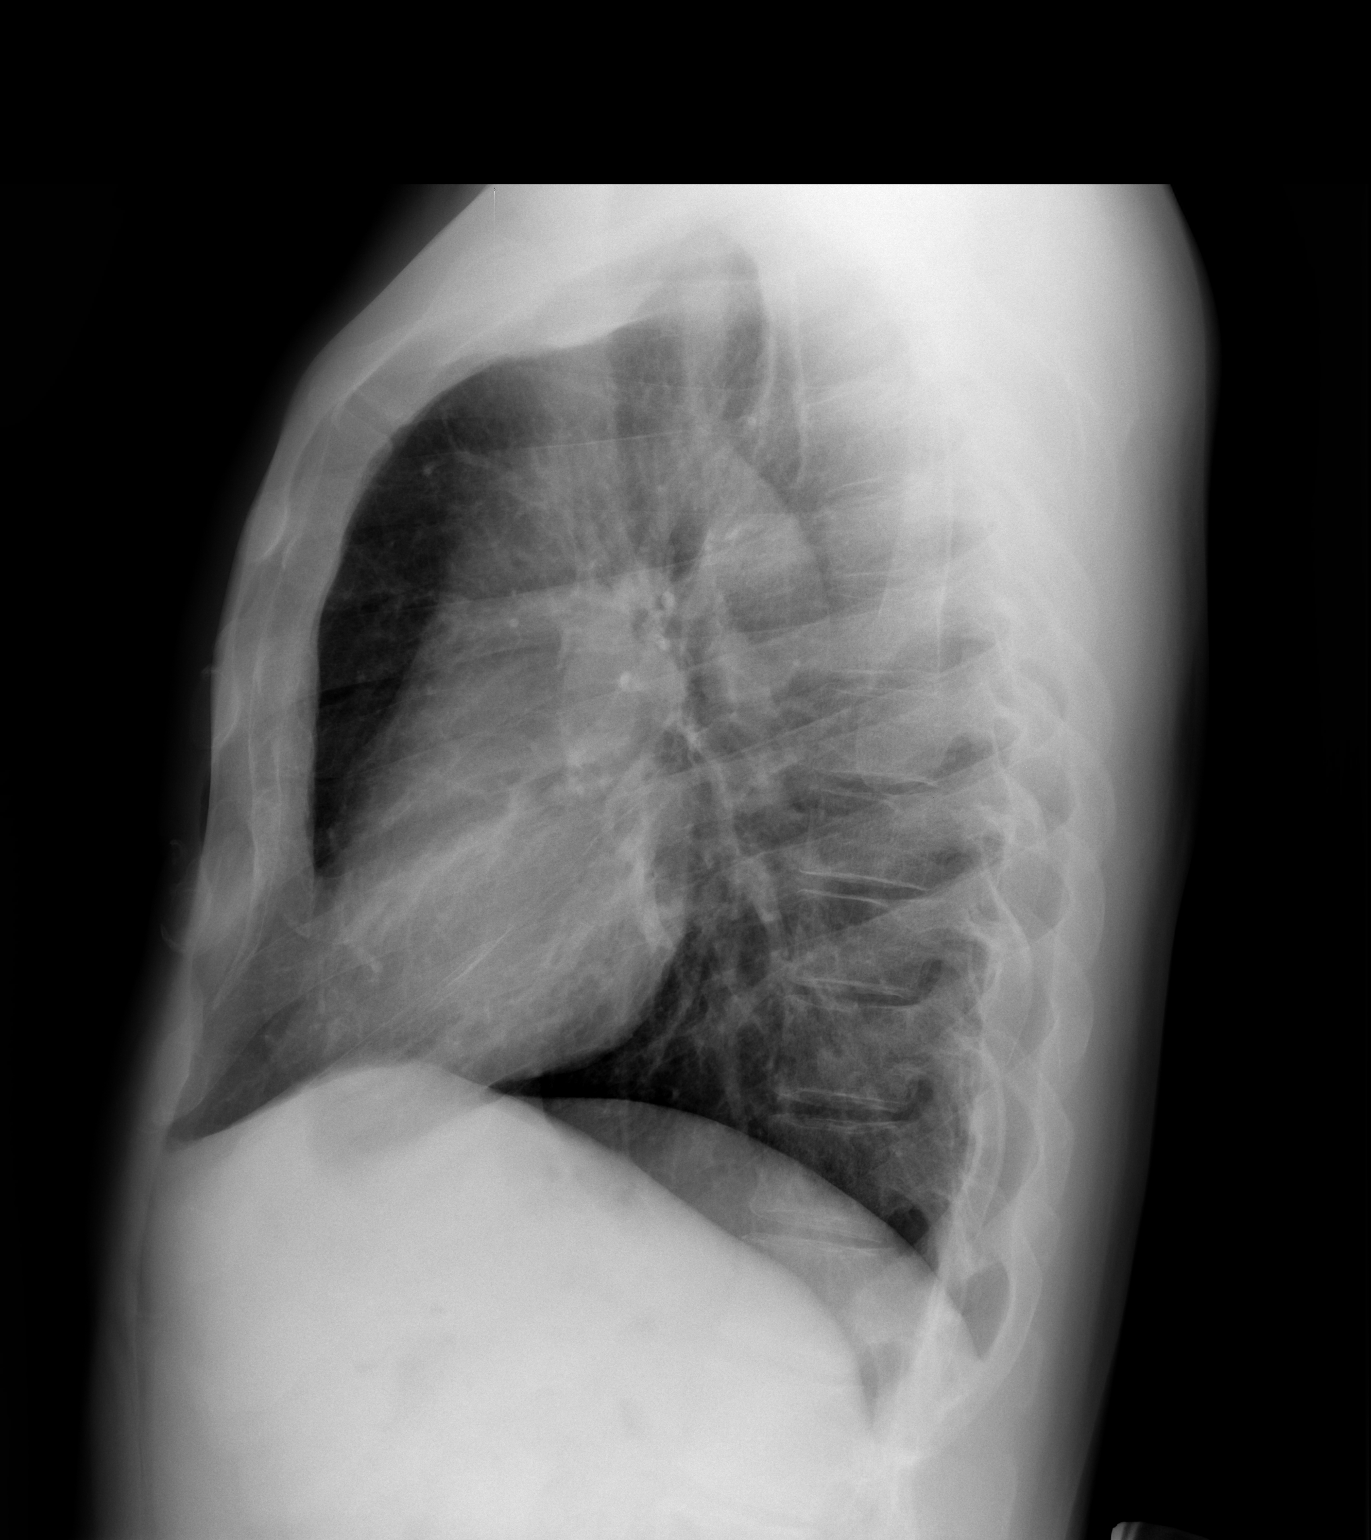

[2 of 2 positions shown; findings below may reference images not displayed]

FINDINGS: The lungs are clear but slightly hyperaerated. Mediastinal and hilar
contours are unremarkable. There is some peribronchial thickening
which may indicate bronchitis. The heart is within normal limits in
size. No bony abnormality is seen.
IMPRESSION: No active lung disease. Hyper aeration. Peribronchial thickening may
indicate bronchitis.

## 2014-12-13 ENCOUNTER — Encounter: Payer: Self-pay | Admitting: Emergency Medicine

## 2014-12-23 ENCOUNTER — Other Ambulatory Visit: Payer: Self-pay | Admitting: Physician Assistant

## 2015-01-03 ENCOUNTER — Other Ambulatory Visit: Payer: Self-pay | Admitting: *Deleted

## 2015-01-03 MED ORDER — BUPROPION HCL ER (XL) 150 MG PO TB24: 150.0000 mg | ORAL_TABLET | Freq: Every day | ORAL | Status: DC

## 2015-02-26 ENCOUNTER — Ambulatory Visit (INDEPENDENT_AMBULATORY_CARE_PROVIDER_SITE_OTHER): Payer: 59 | Admitting: Emergency Medicine

## 2015-02-26 ENCOUNTER — Encounter: Payer: Self-pay | Admitting: Emergency Medicine

## 2015-02-26 VITALS — BP 118/78 | HR 60 | Temp 98.2°F | Resp 18 | Ht 68.0 in | Wt 195.4 lb

## 2015-02-26 DIAGNOSIS — Z0000 Encounter for general adult medical examination without abnormal findings: Secondary | ICD-10-CM

## 2015-02-26 DIAGNOSIS — I10 Essential (primary) hypertension: Secondary | ICD-10-CM

## 2015-02-26 DIAGNOSIS — R739 Hyperglycemia, unspecified: Secondary | ICD-10-CM

## 2015-02-26 DIAGNOSIS — R6889 Other general symptoms and signs: Secondary | ICD-10-CM

## 2015-02-26 DIAGNOSIS — E782 Mixed hyperlipidemia: Secondary | ICD-10-CM

## 2015-02-26 DIAGNOSIS — Z0001 Encounter for general adult medical examination with abnormal findings: Secondary | ICD-10-CM

## 2015-02-26 DIAGNOSIS — R7309 Other abnormal glucose: Secondary | ICD-10-CM

## 2015-02-26 DIAGNOSIS — Z111 Encounter for screening for respiratory tuberculosis: Secondary | ICD-10-CM

## 2015-02-26 DIAGNOSIS — F411 Generalized anxiety disorder: Secondary | ICD-10-CM

## 2015-02-26 DIAGNOSIS — W57XXXA Bitten or stung by nonvenomous insect and other nonvenomous arthropods, initial encounter: Secondary | ICD-10-CM

## 2015-02-26 DIAGNOSIS — E785 Hyperlipidemia, unspecified: Secondary | ICD-10-CM

## 2015-02-26 DIAGNOSIS — Z1212 Encounter for screening for malignant neoplasm of rectum: Secondary | ICD-10-CM

## 2015-02-26 LAB — CBC WITH DIFFERENTIAL/PLATELET
Basophils Absolute: 0 10*3/uL (ref 0.0–0.1)
Basophils Relative: 0 % (ref 0–1)
EOS ABS: 0.2 10*3/uL (ref 0.0–0.7)
Eosinophils Relative: 2 % (ref 0–5)
HCT: 43 % (ref 39.0–52.0)
Hemoglobin: 14.7 g/dL (ref 13.0–17.0)
LYMPHS PCT: 29 % (ref 12–46)
Lymphs Abs: 3.3 10*3/uL (ref 0.7–4.0)
MCH: 31.3 pg (ref 26.0–34.0)
MCHC: 34.2 g/dL (ref 30.0–36.0)
MCV: 91.5 fL (ref 78.0–100.0)
MPV: 10.7 fL (ref 8.6–12.4)
Monocytes Absolute: 0.9 10*3/uL (ref 0.1–1.0)
Monocytes Relative: 8 % (ref 3–12)
Neutro Abs: 7 10*3/uL (ref 1.7–7.7)
Neutrophils Relative %: 61 % (ref 43–77)
PLATELETS: 220 10*3/uL (ref 150–400)
RBC: 4.7 MIL/uL (ref 4.22–5.81)
RDW: 13.6 % (ref 11.5–15.5)
WBC: 11.4 10*3/uL — ABNORMAL HIGH (ref 4.0–10.5)

## 2015-02-26 LAB — HEMOGLOBIN A1C
Hgb A1c MFr Bld: 5.7 % — ABNORMAL HIGH (ref <5.7)
Mean Plasma Glucose: 117 mg/dL — ABNORMAL HIGH (ref <117)

## 2015-02-26 MED ORDER — DOXYCYCLINE HYCLATE 100 MG PO TABS: 100.0000 mg | ORAL_TABLET | Freq: Two times a day (BID) | ORAL | Status: DC

## 2015-02-26 NOTE — Progress Notes (Signed)
Subjective:    Patient ID: Tracy Barrett, male    DOB: 10/18/1976, 39 y.o.   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 He has not been able to follow up as directed for cholesterol and blood sugar abnormal labs due to work schedule. He is working 7 days a week 10-12 hours a day. He has been eating healthier and avoiding fast food. He is not exercising outside of work activity.   He cut left knee 3 weeks ago and has had difficulty with healing. Wound initially stapled by Urgent Care and then steri- stripped but opened back up with activity. Denies redness/ pain. He notes yellow/ clear exudate on bandage.  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 He notes Wellbutrin helps but cannot tolerate higher dose.  He has had tried to decrease tobacco with mothers diagnosis of throat cancer. He is eating cinnamon candy to help.   He is overdue for dentist and knows he has some bad teeth and will schedule appointment in near future.  He has concern about penis size and would like to consider medication to enlarge penis. He denies erection difficulty but has difficulty ejaculating with Wellbutrin.   He pulled tick off left thigh over 1 week ago and still has redness/ swelling/ itch at site. Denies fever/ arthralgias. Lab Results      Component                Value               Date                      WBC                      9.8                 05/22/2014                HGB                      14.7                05/22/2014                HCT                      43.3                05/22/2014                PLT                      235                 05/22/2014                GLUCOSE                  93                  05/22/2014                CHOL                     231*  06/14/2014                TRIG                     299*                06/14/2014                HDL                      36*                 06/14/2014                 LDLCALC                  135*                06/14/2014                ALT                      19                  05/22/2014                AST                      18                  05/22/2014                NA                       141                 05/22/2014                K                        4.3                 05/22/2014                CL                       106                 05/22/2014                CREATININE               0.98                05/22/2014                BUN                      14                  05/22/2014                CO2                      26                  05/22/2014  TSH                      3.352               11/27/2013                PSA                      0.47                11/27/2013                INR                      1.07                10/12/2010                HGBA1C                   5.9*                06/14/2014                  Medication List       This list is accurate as of: 02/26/15 11:59 PM.  Always use your most recent med list.               buPROPion 150 MG 24 hr tablet  Commonly known as:  WELLBUTRIN XL  Take 1 tablet (150 mg total) by mouth daily.     doxycycline 100 MG tablet  Commonly known as:  VIBRA-TABS  Take 1 tablet (100 mg total) by mouth 2 (two) times daily.     Fish Oil 1000 MG Caps  Take by mouth daily.     Magnesium 250 MG Tabs  Take by mouth 2 (two) times daily.     multivitamin with minerals tablet  Take 1 tablet by mouth daily.     Red Yeast Rice 600 MG Caps  Take 1,200 mg by mouth daily.       Allergies  Allergen Reactions  . Celexa [Citalopram Hydrobromide]     No relief  . Pravastatin     Myalgias  . Wellbutrin [Bupropion]     No relief  . Zoloft [Sertraline Hcl]     aggitation   Past Medical History  Diagnosis Date  . Hyperlipidemia   . Vitamin D deficiency    Past Surgical History  Procedure Laterality Date  . Debridement leg Right      post fall from tree  . Ankle fracture surgery Right 1996   History  Substance Use Topics  . Smoking status: Current Every Day Smoker  . Smokeless tobacco: Not on file  . Alcohol Use: Not on file   Family History  Problem Relation Age of Onset  . Depression Father   . Stroke Father   . Diabetes Sister   . Cancer Paternal Uncle     Lung  . Heart disease Maternal Grandfather   . Cancer Mother     throat     MAINTENANCE: Dentist: OVERDUE CXR:05/22/14 WNL  IMMUNIZATIONS: Tdap:2014 Pneumovax:n/a Zostavax:n/a Influenza: declines  Patient Care Team: Lucky Cowboy, MD as PCP - General (Internal Medicine) Jodi Geralds, MD as Consulting Physician (Orthopedic Surgery) Gean Birchwood, MD as Consulting Physician (Orthopedic Surgery)   Review of Systems  Constitutional: Negative for fatigue.  Respiratory: Negative  for cough and shortness of breath.   Cardiovascular: Negative for chest pain.  Genitourinary: Negative for penile swelling and penile pain.  Skin: Positive for rash and wound.  Psychiatric/Behavioral: Negative for suicidal ideas, behavioral problems, sleep disturbance and agitation.  All other systems reviewed and are negative.  BP 118/78 mmHg  Pulse 60  Temp(Src) 98.2 F (36.8 C) (Temporal)  Resp 18  Ht 5\' 8"  (1.727 m)  Wt 195 lb 6.4 oz (88.633 kg)  BMI 29.72 kg/m2      Objective:   Physical Exam  Constitutional: He is oriented to person, place, and time. He appears well-developed and well-nourished.  HENT:  Head: Normocephalic.  Nose: Nose normal.  Mouth/Throat: Oropharynx is clear and moist.  Few dental caries present  Eyes: Conjunctivae and EOM are normal. Pupils are equal, round, and reactive to light. Right eye exhibits no discharge. Left eye exhibits no discharge. No scleral icterus.  Neck: Normal range of motion. Neck supple. No JVD present. No thyromegaly present.  Cardiovascular: Normal rate, regular rhythm, normal heart sounds and intact  distal pulses.   Pulmonary/Chest: Effort normal and breath sounds normal.  Abdominal: Soft. Bowel sounds are normal. He exhibits no distension and no mass. There is no tenderness. There is no rebound and no guarding.  Genitourinary:  Deferred will consider Urology referral  Musculoskeletal: Normal range of motion. He exhibits no edema or tenderness.  Lymphadenopathy:    He has no cervical adenopathy.  Neurological: He is alert and oriented to person, place, and time. He has normal reflexes. No cranial nerve deficit. He exhibits normal muscle tone. Coordination normal.  Skin: Skin is warm and dry. No rash noted.     Psychiatric: He has a normal mood and affect. His behavior is normal. Judgment and thought content normal.  Nursing note and vitals reviewed.      EKG NSCSPT WNL  Assessment & Plan:  1. CPE- Update screening labs/ History/ Immunizations/ Testing as needed. Advised healthy diet, QD exercise, increase H20 and continue RX/ Vitamins AD. Advised needs DENTAL F/u ASAP  2. Elevated Cholesterol/ A1c- recheck labs, Need to eat healthier and exercise AD.   3. TICK bite with proximal irritation- Check labs, DOXY 100 mg AD. Will call with labs and may need to increase duration of treatment pending results.   4. May need Urology referral for concerns with size of penis. Patient will call if needs referral. Advised with young children at home avoid OTC/ herbals with increased risk of exposure. Advised all Mood drugs can have potential for sexual side effects.  5.  Irreg Nevi- monitor for any change, call if occurs for removal  6. Slow healing/ recurrent wound trauma- monitor closely for signs of infection. Area cleaned steri strips applied/ covered with Tegaderm with dirty work environment  7. Anxiety- Controlled currently, continue RX AD w/c if SX increase or ER, recommend counseling if symptoms continue. Increase protein and folic acid intake.  8. Tobacco Dep- advised cessation  techniques and need for d/c to decrease Risk

## 2015-02-26 NOTE — Patient Instructions (Signed)
Lyme Disease You may have been bitten by a tick and are to watch for the development of Lyme Disease. Lyme Disease is an infection that is caused by a bacteria The bacteria causing this disease is named Borreilia burgdorferi. If a tick is infected with this bacteria and then bites you, then Lyme Disease may occur. These ticks are carried by deer and rodents such as rabbits and mice and infest grassy as well as forested areas. Fortunately most tick bites do not cause Lyme Disease.  Lyme Disease is easier to prevent than to treat. First, covering your legs with clothing when walking in areas where ticks are possibly abundant will prevent their attachment because ticks tend to stay within inches of the ground. Second, using insecticides containing DEET can be applied on skin or clothing. Last, because it takes about 12 to 24 hours for the tick to transmit the disease after attachment to the human host, you should inspect your body for ticks twice a day when you are in areas where Lyme Disease is common. You must look thoroughly when searching for ticks. The Ixodes tick that carries Lyme Disease is very small. It is around the size of a sesame seed (picture of tick is not actual size). Removal is best done by grasping the tick by the head and pulling it out. Do not to squeeze the body of the tick. This could inject the infecting bacteria into the bite site. Wash the area of the bite with an antiseptic solution after removal.  Lyme Disease is a disease that may affect many body systems. Because of the small size of the biting tick, most people do not notice being bitten. The first sign of an infection is usually a round red rash that extends out from the center of the tick bite. The center of the lesion may be blood colored (hemorrhagic) or have tiny blisters (vesicular). Most lesions have bright red outer borders and partial central clearing. This rash may extend out many inches in diameter, and multiple lesions may  be present. Other symptoms such as fatigue, headaches, chills and fever, general achiness and swelling of lymph glands may also occur. If this first stage of the disease is left untreated, these symptoms may gradually resolve by themselves, or progressive symptoms may occur because of spread of infection to other areas of the body.  Follow up with your caregiver to have testing and treatment if you have a tick bite and you develop any of the above complaints. Your caregiver may recommend preventative (prophylactic) medications which kill bacteria (antibiotics). Once a diagnosis of Lyme Disease is made, antibiotic treatment is highly likely to cure the disease. Effective treatment of late stage Lyme Disease may require longer courses of antibiotic therapy.  MAKE SURE YOU:   Understand these instructions.  Will watch your condition.  Will get help right away if you are not doing well or get worse. Document Released: 02/21/2001 Document Revised: 02/07/2012 Document Reviewed: 04/25/2009 [facility] Patient Information 2015 [place], [place]. This information is not intended to replace advice given to you by your health care provider. Make sure you discuss any questions you have with your health care provider. [place] Spotted Fever [place] Spotted Fever (RMSF) is the oldest known tick-borne disease of people in the [place]. This disease was named because it was first described among people in the [facility] area who had an illness characterized by a rash with red-purple-black spots. This disease is caused by a rickettsia (Rickettsia  rickettsii), a bacteria carried by the tick. The [facility] wood tick and the American dog tick acquire and transmit the RMSF bacteria (pictures NOT actual size). When a larval, nymphal, or adult tick feeds on an infected rodent or larger animal, the tick can become infected. Infected adult ticks then feed on people who may then get RMSF. The tick transmits  the disease to humans during a prolonged period of feeding that lasts many hours, days, or even a couple weeks. The bite is painless and frequently goes unnoticed. An infected male tick may also pass the rickettsial bacteria to her eggs that then may mature to be infected adult ticks. The rickettsia that causes RMSF can also get into a person's body through damaged skin. A tick bite is not necessary. People can get RMSF if they crush a tick and get its blood or body fluids on their skin through a small cut or sore.  DIAGNOSIS Diagnosis is made by laboratory tests.  TREATMENT Treatment is with antibiotics (medications that kill rickettsia and other bacteria). Immediate treatment usually prevents death. GEOGRAPHIC RANGE This disease was reported only in the [facility] until 1931. RMSF has more recently been described among individuals in all states except [place], [place], and [place]. The highest reported incidences of RMSF now occur among residents of [place], [place], [place], and the [place]. TIME OF YEAR  Most cases are diagnosed during late spring and summer when ticks are most active. However, especially in the warmer [place] states, a few cases occur during the winter. SYMPTOMS   Symptoms of RMSF begin from 2 to 14 days after a tick bite. The most common early symptoms are fever, muscle aches, and headache followed by nausea (feeling sick to your stomach) or vomiting.  The RMSF rash is typically delayed until 3 or more days after symptom onset, and eventually develops in 9 of 10 infected patients by the fifth day of illness. If the disease is not treated it can cause death. If you get a fever, headache, muscle aches, rash, nausea, or vomiting within 2 weeks of a possible tick bite or exposure, you should see your caregiver immediately. PREVENTION Ticks prefer to hide in shady, moist ground litter. They can often be found above the ground clinging to tall grass, brush, shrubs and low  tree branches. They also inhabit lawns and gardens, especially at the edges of woodlands and around old stone walls. Within the areas where ticks generally live, no naturally vegetated area can be considered completely free of infected ticks. The best precaution against RMSF is to avoid contact with soil, leaf litter, and vegetation as much as possible in tick-infested areas. For those who enjoy gardening or walking in their yards, clear brush and mow tall grass around houses and at the edges of gardens. This may help reduce the tick population in the immediate area. Applications of chemical insecticides by a licensed professional in the spring (late May) and fall (September) will also control ticks, especially in heavily infested areas. Treatment will never get rid of all the ticks. Getting rid of small animal populations that host ticks will also decrease the tick population. When working in the garden, [employer], or handling soil and vegetation, wear light-colored protective clothing and gloves. Spot-check often to prevent ticks from reaching the skin. Ticks cannot jump or fly. They will not drop from an above-ground perch onto a passing animal. Once a tick gains access to human skin it climbs upward until it reaches a more protected  area. For example, the back of the knee, groin, navel, armpit, ears, or nape of the neck. It then begins the slow process of embedding itself in the skin. Campers, hikers, field workers, and others who spend time in wooded, brushy, or tall grassy areas can avoid exposure to ticks by using the following precautions:  Wear light-colored clothing with a tight weave to spot ticks more easily and prevent contact with the skin.  Wear long pants tucked into socks, long-sleeved shirts tucked into pants and enclosed shoes or boots along with insect repellent.  Spray clothes with insect repellent containing either DEET or Permethrin. Only DEET can be used on exposed skin. Follow  the manufacturer's directions carefully.  Wear a hat and keep long hair pulled back.  Stay on cleared, well-worn trails whenever possible.  Spot-check yourself and others often for the presence of ticks on clothes. If you find one, there are likely to be others. Check thoroughly.  Remove clothes after leaving tick-infested areas. If possible, wash them to eliminate any unseen ticks. Check yourself, your children and any pets from head to toe for the presence of ticks.  Shower and shampoo. You can greatly reduce your chances of contracting RMSF if you remove attached ticks as soon as possible. Regular checks of the body, including all body sites covered by hair (head, armpits, genitals), allow removal of the tick before rickettsial transmission. To remove an attached tick, use a forceps or tweezers to detach the intact tick without leaving mouth parts in the skin. The tick bite wound should be cleansed after tick removal. Remember the most common symptoms of RMSF are fever, muscle aches, headache, and nausea or vomiting with a later onset of rash. If you get these symptoms after a tick bite and while living in an area where RMSF is found, RMSF should be suspected. If the disease is not treated, it can cause death. See your caregiver immediately if you get these symptoms. Do this even if not aware of a tick bite. Document Released: 02/27/2001 Document Revised: 04/01/2014 Document Reviewed: 10/20/2009 [facility] Patient Information 2015 [place]. This information is not intended to replace advice given to you by your health care provider. Make sure you discuss any questions you have with your health care provider.

## 2015-02-27 ENCOUNTER — Encounter: Payer: Self-pay | Admitting: Internal Medicine

## 2015-02-27 ENCOUNTER — Encounter: Payer: Self-pay | Admitting: Emergency Medicine

## 2015-02-27 DIAGNOSIS — F411 Generalized anxiety disorder: Secondary | ICD-10-CM

## 2015-02-27 HISTORY — DX: Generalized anxiety disorder: F41.1

## 2015-02-27 LAB — LIPID PANEL
Cholesterol: 192 mg/dL (ref 0–200)
HDL: 36 mg/dL — AB (ref >=40)
LDL Cholesterol: 120 mg/dL — ABNORMAL HIGH (ref 0–99)
TRIGLYCERIDES: 179 mg/dL — AB (ref <150)
Total CHOL/HDL Ratio: 5.3 Ratio
VLDL: 36 mg/dL (ref 0–40)

## 2015-02-27 LAB — URINALYSIS, ROUTINE W REFLEX MICROSCOPIC
BILIRUBIN URINE: NEGATIVE
Glucose, UA: NEGATIVE mg/dL
Hgb urine dipstick: NEGATIVE
Ketones, ur: NEGATIVE mg/dL
Leukocytes, UA: NEGATIVE
Nitrite: NEGATIVE
Protein, ur: NEGATIVE mg/dL
Specific Gravity, Urine: 1.019 (ref 1.005–1.030)
Urobilinogen, UA: 0.2 mg/dL (ref 0.0–1.0)
pH: 7 (ref 5.0–8.0)

## 2015-02-27 LAB — HEPATIC FUNCTION PANEL
ALK PHOS: 69 U/L (ref 39–117)
ALT: 28 U/L (ref 0–53)
AST: 26 U/L (ref 0–37)
Albumin: 4.3 g/dL (ref 3.5–5.2)
Bilirubin, Direct: 0.1 mg/dL (ref 0.0–0.3)
Indirect Bilirubin: 0.4 mg/dL (ref 0.2–1.2)
Total Bilirubin: 0.5 mg/dL (ref 0.2–1.2)
Total Protein: 6.8 g/dL (ref 6.0–8.3)

## 2015-02-27 LAB — BASIC METABOLIC PANEL WITH GFR
BUN: 11 mg/dL (ref 6–23)
CO2: 23 mEq/L (ref 19–32)
CREATININE: 0.85 mg/dL (ref 0.50–1.35)
Calcium: 9.4 mg/dL (ref 8.4–10.5)
Chloride: 104 mEq/L (ref 96–112)
GFR, Est Non African American: >89 mL/min
Glucose, Bld: 91 mg/dL (ref 70–99)
POTASSIUM: 4.3 meq/L (ref 3.5–5.3)
SODIUM: 138 meq/L (ref 135–145)

## 2015-02-27 LAB — VITAMIN D 25 HYDROXY (VIT D DEFICIENCY, FRACTURES): Vit D, 25-Hydroxy: 27 ng/mL — ABNORMAL LOW (ref 30–100)

## 2015-02-27 LAB — INSULIN, FASTING: Insulin fasting, serum: 3.3 u[IU]/mL (ref 2.0–19.6)

## 2015-02-27 LAB — URINALYSIS, MICROSCOPIC ONLY
Bacteria, UA: NONE SEEN
Casts: NONE SEEN
Crystals: NONE SEEN
SQUAMOUS EPITHELIAL / LPF: NONE SEEN

## 2015-02-27 LAB — MAGNESIUM: Magnesium: 1.7 mg/dL (ref 1.5–2.5)

## 2015-02-27 LAB — TSH: TSH: 2.981 u[IU]/mL (ref 0.350–4.500)

## 2015-02-28 LAB — LYME ABY, WSTRN BLT IGG & IGM W/BANDS
B BURGDORFERI IGG ABS (IB): NEGATIVE
B burgdorferi IgM Abs (IB): NEGATIVE
LYME DISEASE 23 KD IGM: NONREACTIVE
LYME DISEASE 39 KD IGG: NONREACTIVE
LYME DISEASE 45 KD IGG: NONREACTIVE
LYME DISEASE 58 KD IGG: NONREACTIVE
Lyme Disease 18 kD IgG: NONREACTIVE
Lyme Disease 23 kD IgG: NONREACTIVE
Lyme Disease 28 kD IgG: NONREACTIVE
Lyme Disease 30 kD IgG: NONREACTIVE
Lyme Disease 39 kD IgM: NONREACTIVE
Lyme Disease 41 kD IgG: NONREACTIVE
Lyme Disease 41 kD IgM: NONREACTIVE
Lyme Disease 66 kD IgG: NONREACTIVE
Lyme Disease 93 kD IgG: NONREACTIVE

## 2015-02-28 LAB — TB SKIN TEST
INDURATION: 0 mm
TB SKIN TEST: NEGATIVE

## 2015-03-03 LAB — ROCKY MTN SPOTTED FVR ABS PNL(IGG+IGM)
RMSF IGG: 2.06 IV — AB
RMSF IgM: 0.34 IV

## 2015-05-13 ENCOUNTER — Ambulatory Visit (INDEPENDENT_AMBULATORY_CARE_PROVIDER_SITE_OTHER): Payer: Self-pay | Admitting: Family Medicine

## 2015-05-13 VITALS — BP 114/72 | HR 72 | Temp 98.8°F | Resp 20 | Ht 67.0 in | Wt 189.1 lb

## 2015-05-13 DIAGNOSIS — Z0283 Encounter for blood-alcohol and blood-drug test: Secondary | ICD-10-CM

## 2015-05-13 DIAGNOSIS — Z0189 Encounter for other specified special examinations: Secondary | ICD-10-CM

## 2015-05-13 NOTE — Progress Notes (Signed)
Chief Complaint:  Chief Complaint  Patient presents with  . Drug Screen    HPI: Tracy Barrett is a 39 y.o. male who is here for  Drug screen for work Denies marijuana, illicit drugs, he is just on vitamins.  Had work related injury and was seen at [facility] urgent care and needed drug screen but they do not do it there , he had tried the old [facility] building but now there is a new office. So he came here since they needed a drug screen for this since job related.   Past Medical History  Diagnosis Date  . Hyperlipidemia   . Vitamin D deficiency   . Generalized anxiety disorder 02/27/2015   Past Surgical History  Procedure Laterality Date  . Debridement leg Right     post fall from tree  . Ankle fracture surgery Right 1996   History   Social History  . Marital Status: Married    Spouse Name: N/A  . Number of Children: N/A  . Years of Education: N/A   Social History Main Topics  . Smoking status: Current Every Day Smoker  . Smokeless tobacco: Never Used  . Alcohol Use: 0.0 oz/week    0 Standard drinks or equivalent per week     Comment: occ  . Drug Use: No  . Sexual Activity: Not on file   Other Topics Concern  . None   Social History Narrative   Family History  Problem Relation Age of Onset  . Depression Father   . Stroke Father   . Diabetes Sister   . Cancer Paternal Uncle     Lung  . Heart disease Maternal Grandfather   . Cancer Mother     throat   Allergies  Allergen Reactions  . Celexa [Citalopram Hydrobromide]     No relief  . Pravastatin     Myalgias  . Wellbutrin [Bupropion]     No relief  . Zoloft [Sertraline Hcl]     aggitation   Prior to Admission medications   Medication Sig Start Date End Date Taking? Authorizing Provider  Magnesium 250 MG TABS Take by mouth 2 (two) times daily.   Yes Historical Provider, MD  Multiple Vitamins-Minerals (MULTIVITAMIN WITH MINERALS) tablet Take 1 tablet by mouth daily.   Yes Historical Provider,  MD  Omega-3 Fatty Acids (FISH OIL) 1000 MG CAPS Take by mouth daily.   Yes Historical Provider, MD  Red Yeast Rice 600 MG CAPS Take 1,200 mg by mouth daily.    Yes Historical Provider, MD     ROS: The patient denies fevers, chills, night sweats, unintentional weight loss, chest pain, palpitations, wheezing, dyspnea on exertion, nausea, vomiting, abdominal pain, dysuria, hematuria, melena, numbness, weakness, or tingling.  All other systems have been reviewed and were otherwise negative with the exception of those mentioned in the HPI and as above.    PHYSICAL EXAM: Filed Vitals:   05/13/15 2033  BP: 114/72  Pulse: 72  Temp: 98.8 F (37.1 C)  Resp: 20   Filed Vitals:   05/13/15 2033  Height: _0  (1.702 m)  Weight: 189 lb 2 oz (85.787 kg)   Body mass index is 29.61 kg/(m^2).   General: Alert, no acute distress HEENT:  Normocephalic, atraumatic, oropharynx patent. EOMI Cardiovascular:  Regular rate and rhythm, no rubs murmurs or gallops.   No pedal edema.  Respiratory: Clear to auscultation bilaterally.  No wheezes, rales, or rhonchi.  No cyanosis, no use  of accessory musculature GI: No organomegaly, abdomen is soft and non-tender, positive bowel sounds.  No masses. Skin: No rashes. Neurologic: Facial musculature symmetric. Psychiatric: Patient is appropriate throughout our interaction. Musculoskeletal: Gait intact.   LABS: Results for orders placed or performed in visit on 02/26/15  Hepatic function panel  Result Value Ref Range   Total Bilirubin 0.5 0.2 - 1.2 mg/dL   Bilirubin, Direct 0.1 0.0 - 0.3 mg/dL   Indirect Bilirubin 0.4 0.2 - 1.2 mg/dL   Alkaline Phosphatase 69 39 - 117 U/L   AST 26 0 - 37 U/L   ALT 28 0 - 53 U/L   Total Protein 6.8 6.0 - 8.3 g/dL   Albumin 4.3 3.5 - 5.2 g/dL  Lipid panel  Result Value Ref Range   Cholesterol 192 0 - 200 mg/dL   Triglycerides 179 (H) <150 mg/dL   HDL 36 (L) >=40 mg/dL   Total CHOL/HDL Ratio 5.3 Ratio   VLDL 36 0 - 40  mg/dL   LDL Cholesterol 120 (H) 0 - 99 mg/dL  BASIC METABOLIC PANEL WITH GFR  Result Value Ref Range   Sodium 138 135 - 145 mEq/L   Potassium 4.3 3.5 - 5.3 mEq/L   Chloride 104 96 - 112 mEq/L   CO2 23 19 - 32 mEq/L   Glucose, Bld 91 70 - 99 mg/dL   BUN 11 6 - 23 mg/dL   Creat 0.85 0.50 - 1.35 mg/dL   Calcium 9.4 8.4 - 10.5 mg/dL   GFR, Est African American >89 mL/min   GFR, Est Non African American >89 mL/min  Insulin, fasting  Result Value Ref Range   Insulin fasting, serum 3.3 2.0 - 19.6 uIU/mL  Hemoglobin A1c  Result Value Ref Range   Hgb A1c MFr Bld 5.7 (H) <5.7 %   Mean Plasma Glucose 117 (H) <117 mg/dL  CBC with Differential/Platelet  Result Value Ref Range   WBC 11.4 (H) 4.0 - 10.5 K/uL   RBC 4.70 4.22 - 5.81 MIL/uL   Hemoglobin 14.7 13.0 - 17.0 g/dL   HCT 43.0 39.0 - 52.0 %   MCV 91.5 78.0 - 100.0 fL   MCH 31.3 26.0 - 34.0 pg   MCHC 34.2 30.0 - 36.0 g/dL   RDW 13.6 11.5 - 15.5 %   Platelets 220 150 - 400 K/uL   MPV 10.7 8.6 - 12.4 fL   Neutrophils Relative % 61 43 - 77 %   Neutro Abs 7.0 1.7 - 7.7 K/uL   Lymphocytes Relative 29 12 - 46 %   Lymphs Abs 3.3 0.7 - 4.0 K/uL   Monocytes Relative 8 3 - 12 %   Monocytes Absolute 0.9 0.1 - 1.0 K/uL   Eosinophils Relative 2 0 - 5 %   Eosinophils Absolute 0.2 0.0 - 0.7 K/uL   Basophils Relative 0 0 - 1 %   Basophils Absolute 0.0 0.0 - 0.1 K/uL   Smear Review Criteria for review not met   TSH  Result Value Ref Range   TSH 2.981 0.350 - 4.500 uIU/mL  Vit D  25 hydroxy (rtn osteoporosis monitoring)  Result Value Ref Range   Vit D, 25-Hydroxy 27 (L) 30 - 100 ng/mL  Magnesium  Result Value Ref Range   Magnesium 1.7 1.5 - 2.5 mg/dL  Urinalysis, Routine w reflex microscopic  Result Value Ref Range   Color, Urine YELLOW YELLOW   APPearance TURBID (A) CLEAR   Specific Gravity, Urine 1.019 1.005 - 1.030   pH  7.0 5.0 - 8.0   Glucose, UA NEG NEG mg/dL   Bilirubin Urine NEG NEG   Ketones, ur NEG NEG mg/dL   Hgb urine  dipstick NEG NEG   Protein, ur NEG NEG mg/dL   Urobilinogen, UA 0.2 0.0 - 1.0 mg/dL   Nitrite NEG NEG   Leukocytes, UA NEG NEG  Lyme Aby, Wstrn. Blt. IgG & IgM w/bands  Result Value Ref Range   B burgdorferi IgG Abs (IB) Negative    Lyme Disease 18 kD IgG Non Reactive    Lyme Disease 23 kD IgG Non Reactive    Lyme Disease 28 kD IgG Non Reactive    Lyme Disease 30 kD IgG Non Reactive    Lyme Disease 39 kD IgG Non Reactive    Lyme Disease 41 kD IgG Non Reactive    Lyme Disease 45 kD IgG Non Reactive    Lyme Disease 58 kD IgG Non Reactive    Lyme Disease 66 kD IgG Non Reactive    Lyme Disease 93 kD IgG Non Reactive    B burgdorferi IgM Abs (IB) Negative    Lyme Disease 23 kD IgM Non Reactive    Lyme Disease 39 kD IgM Non Reactive    Lyme Disease 41 kD IgM Non Reactive   Rocky mtn spotted fvr abs pnl(IgG+IgM)  Result Value Ref Range   RMSF IgG 2.06 (H) IV   RMSF IgM 0.34 IV  Urine Microscopic  Result Value Ref Range   Squamous Epithelial / LPF NONE SEEN RARE   Crystals NONE SEEN NONE SEEN   Casts NONE SEEN NONE SEEN   WBC, UA 0-2 <3 WBC/hpf   RBC / HPF 0-2 <3 RBC/hpf   Bacteria, UA NONE SEEN RARE  TB Skin Test  Result Value Ref Range   TB Skin Test Negative    Induration 0 mm     EKG/XRAY:   Primary read interpreted by Dr. Marin Comment at [facility].   ASSESSMENT/PLAN: Encounter Diagnosis  Name Primary?  . Encounter for drug screening Yes   Self pay drug screen  Gross sideeffects, risk and benefits, and alternatives of medications d/w patient. Patient is aware that all medications have potential sideeffects and we are unable to predict every sideeffect or drug-drug interaction that may occur.  Shahan Starks, [place], DO 05/14/2015 11:50 AM

## 2015-05-16 ENCOUNTER — Encounter: Payer: Self-pay | Admitting: Family Medicine

## 2015-05-17 ENCOUNTER — Telehealth: Payer: Self-pay | Admitting: Family Medicine

## 2015-05-17 NOTE — Telephone Encounter (Signed)
i will put a copy in your box of drugscreen

## 2016-03-04 ENCOUNTER — Encounter: Payer: Self-pay | Admitting: Internal Medicine

## 2016-04-23 ENCOUNTER — Encounter: Payer: Self-pay | Admitting: Internal Medicine

## 2017-03-08 ENCOUNTER — Encounter: Payer: Self-pay | Admitting: Internal Medicine

## 2017-12-29 DIAGNOSIS — R7303 Prediabetes: Secondary | ICD-10-CM

## 2017-12-29 DIAGNOSIS — R7309 Other abnormal glucose: Secondary | ICD-10-CM | POA: Insufficient documentation

## 2017-12-29 HISTORY — DX: Prediabetes: R73.03

## 2017-12-29 NOTE — Progress Notes (Signed)
Complete Physical  Assessment and Plan:  Diagnoses and all orders for this visit:  Encounter for routine adult physical exam with abnormal findings  Hyperlipidemia, unspecified hyperlipidemia type At last check above goal; has been treated thus far with omega 3 and red yeast rice supplement If remains elevated, will discuss statin -  Continue low cholesterol diet and exercise.  Check lipid panel.  -     Lipid panel -     TSH  Vitamin D deficiency Continue supplementation as needed for goal of 70-100 -     VITAMIN D 25 Hydroxy (Vit-D Deficiency, Fractures)  Traumatic injury of right lower extremity, sequela Remote; no issues  Generalized anxiety disorder Initiating lexapro 10 mg daily today; discussed risks and SE, may take 6-8 weeks to note improvement Buspar 5-10 mg TID for short term coverage - serotonin syndrome discussed, information provided on AVS Information for cognitive behavioral therapy discussed for anxiety and alcohol abuse discussed and provided Deep breathing and stress management techniques reviewed -     escitalopram (LEXAPRO) 10 MG tablet; Take 1 tablet (10 mg total) by mouth daily. -     busPIRone (BUSPAR) 10 MG tablet; Take 1/2-1 tab three times a day. Start low and taper up.  Screening for cardiovascular condition -     EKG 12-Lead  Screening for hematuria or proteinuria -     Urinalysis w microscopic + reflex cultur  Medication management -     CBC with Differential/Platelet -     BASIC METABOLIC PANEL WITH GFR -     Hepatic function panel  Screening for colorectal cancer -     POC Hemoccult Bld/Stl (3-Cd Home Screen); Future  Prediabetes Discussed disease and risks Discussed diet/exercise, weight management  -     Hemoglobin A1c  Alcohol abuse Discussed readiness to cut back; he drinks excessively but not daily Discussed recommendations are 1-2 drinks per day for men Discussed methods for cutting back/quitting Discussed cognitive behavioral  therapy - information provided Will work on anxiety and stress management Follow up in 3 months -     Vitamin B12  Screening for HIV (human immunodeficiency virus) -     HIV antibody  Overweight (BMI 25.0-29.9) Long discussion about weight loss, diet, and exercise Recommended diet heavy in fruits and veggies and low in animal meats, cheeses, and dairy products, appropriate calorie intake Discussed appropriate weight for height  Follow up at 3 months   Discussed med's effects and SE's. Screening labs and tests as requested with regular follow-up as recommended. Over 40 minutes of exam, counseling, chart review and critical decision making was performed  Future Appointments  Date Time Provider Department Center  04/07/2018  8:45 AM [name], NP [facility] None  01/05/2019  9:30 AM [name], NP [facility] None     HPI BP 104/76   Pulse 63   Temp 97.7 F (36.5 C)   Ht 5\' 8"  (1.727 m)   Wt 191 lb 9.6 oz (86.9 kg)   SpO2 97%   BMI 29.13 kg/m   Patient is a 42 y/o Caucasian married male with 4 children who presents for a complete physical. has Hyperlipidemia; Vitamin D deficiency; Traumatic leg injury; Generalized anxiety disorder; and Prediabetes on their problem list.  He has been prescribed celexa, zoloft, wellbutrin - he reports he had sides effects with these - he had moodiness/felt mean, some caused difficulty sleeping. Describes severe ongoing stress with anxiety over the past year related to work and family. He reports  he has been drinking 1-2 liters of whisky weekly over the past year. He has awareness that this is a problem and would like to control his anxiety and stress better  he currently continues to smoke 0.5 pack a day; discussed risks associated with smoking, patient is not ready to quit at this time, but is interested in doing so in the future with awareness of risks.   BMI is Body mass index is 29.13 kg/m., he has not been working on diet and  exercise. Wt Readings from Last 3 Encounters:  12/30/17 191 lb 9.6 oz (86.9 kg)  05/13/15 189 lb 2 oz (85.8 kg)  02/26/15 195 lb 6.4 oz (88.6 kg)   Today their BP is BP: 104/76 He does not workout, but is at a physically intense job. He denies chest pain, shortness of breath, dizziness.   He is not on cholesterol medication (currently treated by red yeast rice and omega 3) and denies myalgias. His cholesterol is not at goal. The cholesterol last visit was:   Lab Results  Component Value Date   CHOL 192 02/26/2015   HDL 36 (L) 02/26/2015   LDLCALC 120 (H) 02/26/2015   TRIG 179 (H) 02/26/2015   CHOLHDL 5.3 02/26/2015   He has not been working on diet and exercise for prediabetes, he is not on bASA, he is not on ACE/ARB and denies increased appetite, nausea, paresthesia of the feet, polydipsia, polyuria, visual disturbances, vomiting and weight loss. Last A1C in the office was:  Lab Results  Component Value Date   HGBA1C 5.7 (H) 02/26/2015   Last GFR: Lab Results  Component Value Date   [facility]89 02/26/2015   Patient is not on Vitamin D supplement and low at last check:    Lab Results  Component Value Date   VD25OH 27 (L) 02/26/2015     Last PSA was: Lab Results  Component Value Date   PSA 0.47 11/27/2013    Current Medications:  Current Outpatient Medications on File Prior to Visit  Medication Sig Dispense Refill  . Magnesium 250 MG TABS Take by mouth 2 (two) times daily.    . Multiple Vitamins-Minerals (MULTIVITAMIN WITH MINERALS) tablet Take 1 tablet by mouth daily.    . Omega-3 Fatty Acids (FISH OIL) 1000 MG CAPS Take by mouth daily.    . Red Yeast Rice 600 MG CAPS Take 1,200 mg by mouth daily.      No current facility-administered medications on file prior to visit.    Allergies:  Allergies  Allergen Reactions  . Celexa [Citalopram Hydrobromide]     No relief  . Pravastatin     Myalgias  . Wellbutrin [Bupropion]     No relief  . Zoloft [Sertraline Hcl]      aggitation   Health Maintenance:  Immunization History  Administered Date(s) Administered  . PPD Test 11/27/2013, 02/26/2015  . Td 11/29/2008  . Tdap 11/27/2013   Tetanus: 2014 Flu vaccine: Refuses  Colonoscopy: n/a EGD: n/a  Eye Exam: 2015? - encouraged to follow up Dentist: Never -  Encouraged to schedule at least annually  Patient Care Team: Lucky CowboyMcKeown, William, MD as PCP - General (Internal Medicine) Jodi GeraldsGraves, John, MD as Consulting Physician (Orthopedic Surgery) Gean Birchwoodowan, Frank, MD as Consulting Physician (Orthopedic Surgery)  Medical History:  has Hyperlipidemia; Vitamin D deficiency; Traumatic leg injury; Generalized anxiety disorder; and Prediabetes on their problem list. Surgical History:  He  has a past surgical history that includes Debridement leg (Right); Ankle fracture surgery (  Right, 1996); and Vasectomy (2014). Family History:  His family history includes Cancer in his mother and paternal uncle; Depression in his father; Diabetes in his sister; Heart disease in his maternal grandfather; Stroke in his father. Social History:   reports that he has been smoking cigarettes.  He started smoking about 31 years ago. He has been smoking about 0.50 packs per day. he has never used smokeless tobacco. He reports that he drinks alcohol. He reports that he does not use drugs. Review of Systems:  Review of Systems  Constitutional: Negative for malaise/fatigue and weight loss.  HENT: Negative for hearing loss and tinnitus.   Eyes: Negative for blurred vision and double vision.  Respiratory: Negative for cough, shortness of breath and wheezing.   Cardiovascular: Negative for chest pain, palpitations, orthopnea, claudication and leg swelling.  Gastrointestinal: Negative for abdominal pain, blood in stool, constipation, diarrhea, heartburn, melena, nausea and vomiting.  Genitourinary: Negative.   Musculoskeletal: Negative for joint pain and myalgias.  Skin: Negative for rash.   Neurological: Negative for dizziness, tingling, sensory change, weakness and headaches.  Endo/Heme/Allergies: Negative for polydipsia.  Psychiatric/Behavioral: Positive for substance abuse. Negative for depression, hallucinations, memory loss and suicidal ideas. The patient is nervous/anxious. The patient does not have insomnia.   All other systems reviewed and are negative.   Physical Exam: Estimated body mass index is 29.13 kg/m as calculated from the following:   Height as of this encounter: 5\' 8"  (1.727 m).   Weight as of this encounter: 191 lb 9.6 oz (86.9 kg). BP 104/76   Pulse 63   Temp 97.7 F (36.5 C)   Ht 5\' 8"  (1.727 m)   Wt 191 lb 9.6 oz (86.9 kg)   SpO2 97%   BMI 29.13 kg/m  General Appearance: Well nourished, in no apparent distress.  Eyes: PERRLA, EOMs, conjunctiva no swelling or erythema, normal fundi and vessels.  Sinuses: No Frontal/maxillary tenderness  ENT/Mouth: Ext aud canals clear, normal light reflex with TMs without erythema, bulging. Poor dentition with halitosis; tongue appears large, smooth, somewhat red. No erythema, swelling, or exudate on post pharynx. Tonsils not swollen or erythematous. Hearing normal.  Neck: Supple, thyroid normal. No bruits  Respiratory: Respiratory effort normal, BS equal bilaterally without rales, rhonchi, wheezing or stridor.  Cardio: RRR without murmurs, rubs or gallops. Brisk peripheral pulses without edema.  Chest: symmetric, with normal excursions and percussion.  Abdomen: Soft, nontender, no guarding, rebound, hernias, masses, or organomegaly.  Lymphatics: Non tender without lymphadenopathy.  Genitourinary: No concerns; defer Musculoskeletal: Full ROM all peripheral extremities,5/5 strength, and normal gait.  Skin: Warm, dry without rashes, lesions, ecchymosis. Neuro: Cranial nerves intact, reflexes equal bilaterally. Normal muscle tone, no cerebellar symptoms. Sensation intact.  Psych: Awake and oriented X 3, normal  affect, Insight and Judgment appropriate.   EKG: WNL - sinus brady - no changes.  Carlyon Shadow Suetta Hoffmeister 12:36 PM [facility]

## 2017-12-30 ENCOUNTER — Encounter: Payer: Self-pay | Admitting: Adult Health

## 2017-12-30 ENCOUNTER — Ambulatory Visit: Payer: 59 | Admitting: Adult Health

## 2017-12-30 VITALS — BP 104/76 | HR 63 | Temp 97.7°F | Ht 68.0 in | Wt 191.6 lb

## 2017-12-30 DIAGNOSIS — I10 Essential (primary) hypertension: Secondary | ICD-10-CM

## 2017-12-30 DIAGNOSIS — S8991XS Unspecified injury of right lower leg, sequela: Secondary | ICD-10-CM

## 2017-12-30 DIAGNOSIS — Z0001 Encounter for general adult medical examination with abnormal findings: Secondary | ICD-10-CM

## 2017-12-30 DIAGNOSIS — Z136 Encounter for screening for cardiovascular disorders: Secondary | ICD-10-CM

## 2017-12-30 DIAGNOSIS — Z1212 Encounter for screening for malignant neoplasm of rectum: Secondary | ICD-10-CM

## 2017-12-30 DIAGNOSIS — Z79899 Other long term (current) drug therapy: Secondary | ICD-10-CM | POA: Diagnosis not present

## 2017-12-30 DIAGNOSIS — E559 Vitamin D deficiency, unspecified: Secondary | ICD-10-CM

## 2017-12-30 DIAGNOSIS — R7303 Prediabetes: Secondary | ICD-10-CM

## 2017-12-30 DIAGNOSIS — Z0000 Encounter for general adult medical examination without abnormal findings: Secondary | ICD-10-CM | POA: Diagnosis not present

## 2017-12-30 DIAGNOSIS — Z1211 Encounter for screening for malignant neoplasm of colon: Secondary | ICD-10-CM

## 2017-12-30 DIAGNOSIS — E663 Overweight: Secondary | ICD-10-CM

## 2017-12-30 DIAGNOSIS — E782 Mixed hyperlipidemia: Secondary | ICD-10-CM

## 2017-12-30 DIAGNOSIS — Z114 Encounter for screening for human immunodeficiency virus [HIV]: Secondary | ICD-10-CM

## 2017-12-30 DIAGNOSIS — F1010 Alcohol abuse, uncomplicated: Secondary | ICD-10-CM

## 2017-12-30 DIAGNOSIS — F411 Generalized anxiety disorder: Secondary | ICD-10-CM

## 2017-12-30 DIAGNOSIS — Z1389 Encounter for screening for other disorder: Secondary | ICD-10-CM

## 2017-12-30 DIAGNOSIS — E785 Hyperlipidemia, unspecified: Secondary | ICD-10-CM | POA: Diagnosis not present

## 2017-12-30 MED ORDER — BUSPIRONE HCL 10 MG PO TABS: ORAL_TABLET | ORAL | 2 refills | Status: DC

## 2017-12-30 MED ORDER — ESCITALOPRAM OXALATE 10 MG PO TABS: 10.0000 mg | ORAL_TABLET | Freq: Every day | ORAL | 2 refills | Status: DC

## 2017-12-30 NOTE — Patient Instructions (Signed)
Start lexapro 10 mg daily - this medication may make you feel "like a zombie" for 2 weeks or so, might have some nausea during that time as well but should improve. You might start noticing the effects of this medication in 6-8 weeks.  Buspar 5-10 mg three times a day - short term medication until lexapro starts to kick in - start low - 5 mg twice a day, etc then gradually work your way up.    Counseling services  Here are some numbers below you can try but I suggest calling your insurance and finding out who is in your network and THEN calling those people or looking them up on google.   I'm a big fan of Cognitive Behavioral Therapy, look this up on You tube or check with the therapist you see if they are certified.  This form of therapy helps to teach you skills to better handle with current situation that are causing anxiety or depression.   Neuropsychiatric care [place], NP [facility] [place].  [phone] Fax [phone]  [facility] Hours: Monday-Thursday 830-8pm  Friday 830AM-7PM Address: [place] [phone].  Address: [place], [place] [address] [facility] [phone] office [email or web address] [address], [address] [place], [place], [place] [address]  Dr. Arbutus Leas, Ph.D. [address], [place] [address] Phone: [phone], [place] [phone] [facility] [address] [place] [address]   Toy Cookey, MA, clinical psychologist  Cognitive-Behavior Therapy; Mood Disorders; Anxiety Disorders; adult and child ADHD; Family Therapy; Stress Management; personal growth, and Marital Therapy.    Terrance Mass Ph.D., clinical psychologist Cognitive-Behavior Therapy; Mood Disorders; Anxiety Disorders; Stress     Management  [facility] [address]  [place], [place] [address] [phone]   [facility] [place], psychotherapist [address] [place], [place] [address] [phone]  Karin Golden Ph.D., clinical psychologist [phone] office [place], [place] [address] Cognitive Behavior Therapy, Depression, Bipolar, Anxiety, Grief and Loss    Please be aware that some of the medications that you are on can sometimes cause a rare and potentially dangerous adverse reaction, called SEROTONIN SYNDROME: Symptoms of this condition include (but are not limited to):  Agitation or restlessness, confusion, rapid heart rate and high blood pressure, dilated pupils, loss of muscle coordination or twitching muscles, muscle rigidity/stiffness, sweating and/or flushing, diarrhea, headache, shivering, goose bumps. If you have any of these symptoms you may have to stop the medication. Call your health care provider immediately.  Severe serotonin syndrome can be life-threatening emergency. Signs and symptoms of a severe reaction may include: high fever, seizures, irregular heartbeat, unconsciousness or altered level of awareness or personality changes.  If you have any of these new symptoms, call 911 or have someone take you to the emergency room.     Stress and Stress Management Stress is a normal reaction to life events. It is what you feel when life demands more than you are used to or more than you can handle. Some stress can be useful. For example, the stress reaction can help you catch the last bus of the day, study for a test, or meet a deadline at work. But stress that occurs too often or for too long can cause problems. It can affect your emotional health and interfere with relationships and normal daily activities. Too  much stress can weaken your immune system and increase your risk for physical illness. If you already have a medical problem, stress can make it worse. What are the causes? All sorts of life events may  cause stress. An event that causes stress for one person may not be stressful for another person. Major life events commonly cause stress. These may be positive or negative. Examples include losing your job, moving into a new home, getting married, having a baby, or losing a loved one. Less obvious life events may also cause stress, especially if they occur day after day or in combination. Examples include working long hours, driving in traffic, caring for children, being in debt, or being in a difficult relationship. What are the signs or symptoms? Stress may cause emotional symptoms including, the following:  Anxiety. This is feeling worried, afraid, on edge, overwhelmed, or out of control.  Anger. This is feeling irritated or impatient.  Depression. This is feeling sad, down, helpless, or guilty.  Difficulty focusing, remembering, or making decisions.  Stress may cause physical symptoms, including the following:  Aches and pains. These may affect your head, neck, back, stomach, or other areas of your body.  Tight muscles or clenched jaw.  Low energy or trouble sleeping.  Stress may cause unhealthy behaviors, including the following:  Eating to feel better (overeating) or skipping meals.  Sleeping too little, too much, or both.  Working too much or putting off tasks (procrastination).  Smoking, drinking alcohol, or using drugs to feel better.  How is this diagnosed? Stress is diagnosed through an assessment by your health care provider. Your health care provider will ask questions about your symptoms and any stressful life events.Your health care provider will also ask about your medical history and may order blood tests or other tests. Certain medical conditions and medicine can cause physical symptoms similar to stress. Mental illness can cause emotional symptoms and unhealthy behaviors similar to stress. Your health care provider may refer you to a mental health professional for  further evaluation. How is this treated? Stress management is the recommended treatment for stress.The goals of stress management are reducing stressful life events and coping with stress in healthy ways. Techniques for reducing stressful life events include the following:  Stress identification. Self-monitor for stress and identify what causes stress for you. These skills may help you to avoid some stressful events.  Time management. Set your priorities, keep a calendar of events, and learn to say "no." These tools can help you avoid making too many commitments.  Techniques for coping with stress include the following:  Rethinking the problem. Try to think realistically about stressful events rather than ignoring them or overreacting. Try to find the positives in a stressful situation rather than focusing on the negatives.  Exercise. Physical exercise can release both physical and emotional tension. The key is to find a form of exercise you enjoy and do it regularly.  Relaxation techniques. These relax the body and mind. Examples include yoga, meditation, tai chi, biofeedback, deep breathing, progressive muscle relaxation, listening to music, being out in nature, journaling, and other hobbies. Again, the key is to find one or more that you enjoy and can do regularly.  Healthy lifestyle. Eat a balanced diet, get plenty of sleep, and do not smoke. Avoid using alcohol or drugs to relax.  Strong support network. Spend time with family, friends, or other people you enjoy being around.Express your feelings and talk things over with someone you trust.  Counseling or talktherapy with a mental health professional may be helpful if you are having difficulty managing stress on your own. Medicine is typically not recommended for the treatment of stress.Talk to your health care provider if you think you need medicine for symptoms of stress. Follow these instructions at home:  Keep all follow-up visits  as directed by your health care provider.  Take all medicines as directed by your health care provider. Contact a health care provider if:  Your symptoms get worse or you start having new symptoms.  You feel overwhelmed by your problems and can no longer manage them on your own. Get help right away if:  You feel like hurting yourself or someone else. This information is not intended to replace advice given to you by your health care provider. Make sure you discuss any questions you have with your health care provider. Document Released: 05/11/2001 Document Revised: 04/22/2016 Document Reviewed: 07/10/2013 Elsevier Interactive Patient Education  2017 [employer].

## 2017-12-31 LAB — URINALYSIS W MICROSCOPIC + REFLEX CULTURE
Bacteria, UA: NONE SEEN /HPF
Bilirubin Urine: NEGATIVE
GLUCOSE, UA: NEGATIVE
HYALINE CAST: NONE SEEN /LPF
Hgb urine dipstick: NEGATIVE
Ketones, ur: NEGATIVE
Leukocyte Esterase: NEGATIVE
NITRITES URINE, INITIAL: NEGATIVE
Protein, ur: NEGATIVE
RBC / HPF: NONE SEEN /HPF (ref 0–2)
Specific Gravity, Urine: 1.022 (ref 1.001–1.03)
Squamous Epithelial / LPF: NONE SEEN /HPF (ref <=5)
WBC, UA: NONE SEEN /HPF (ref 0–5)
pH: 7 (ref 5.0–8.0)

## 2017-12-31 LAB — HEPATIC FUNCTION PANEL
AG Ratio: 1.7 (calc) (ref 1.0–2.5)
ALT: 20 U/L (ref 9–46)
AST: 18 U/L (ref 10–40)
Albumin: 4.4 g/dL (ref 3.6–5.1)
Alkaline phosphatase (APISO): 70 U/L (ref 40–115)
BILIRUBIN DIRECT: 0.1 mg/dL (ref 0.0–0.2)
BILIRUBIN INDIRECT: 0.5 mg/dL (ref 0.2–1.2)
BILIRUBIN TOTAL: 0.6 mg/dL (ref 0.2–1.2)
GLOBULIN: 2.6 g/dL (ref 1.9–3.7)
Total Protein: 7 g/dL (ref 6.1–8.1)

## 2017-12-31 LAB — CBC WITH DIFFERENTIAL/PLATELET
BASOS PCT: 0.3 %
Basophils Absolute: 29 cells/uL (ref 0–200)
EOS ABS: 233 {cells}/uL (ref 15–500)
Eosinophils Relative: 2.4 %
HEMATOCRIT: 47.7 % (ref 38.5–50.0)
HEMOGLOBIN: 16 g/dL (ref 13.2–17.1)
LYMPHS ABS: 3046 {cells}/uL (ref 850–3900)
MCH: 30.1 pg (ref 27.0–33.0)
MCHC: 33.5 g/dL (ref 32.0–36.0)
MCV: 89.7 fL (ref 80.0–100.0)
MPV: 11 fL (ref 7.5–12.5)
Monocytes Relative: 7.7 %
Neutro Abs: 5645 cells/uL (ref 1500–7800)
Neutrophils Relative %: 58.2 %
PLATELETS: 224 10*3/uL (ref 140–400)
RBC: 5.32 10*6/uL (ref 4.20–5.80)
RDW: 12.4 % (ref 11.0–15.0)
Total Lymphocyte: 31.4 %
WBC mixed population: 747 cells/uL (ref 200–950)
WBC: 9.7 10*3/uL (ref 3.8–10.8)

## 2017-12-31 LAB — BASIC METABOLIC PANEL WITH GFR
BUN: 19 mg/dL (ref 7–25)
CO2: 25 mmol/L (ref 20–32)
CREATININE: 0.87 mg/dL (ref 0.60–1.35)
Calcium: 9.7 mg/dL (ref 8.6–10.3)
Chloride: 105 mmol/L (ref 98–110)
GFR, EST AFRICAN AMERICAN: 123 mL/min/{1.73_m2} (ref >=60)
GFR, Est Non African American: 106 mL/min/{1.73_m2} (ref >=60)
Glucose, Bld: 89 mg/dL (ref 65–99)
Potassium: 4.5 mmol/L (ref 3.5–5.3)
Sodium: 138 mmol/L (ref 135–146)

## 2017-12-31 LAB — HIV ANTIBODY (ROUTINE TESTING W REFLEX): HIV: NONREACTIVE

## 2017-12-31 LAB — HEMOGLOBIN A1C
Hgb A1c MFr Bld: 5.7 % of total Hgb — ABNORMAL HIGH (ref <5.7)
MEAN PLASMA GLUCOSE: 117 (calc)
eAG (mmol/L): 6.5 (calc)

## 2017-12-31 LAB — LIPID PANEL
CHOL/HDL RATIO: 5.8 (calc) — AB (ref <5.0)
Cholesterol: 243 mg/dL — ABNORMAL HIGH (ref <200)
HDL: 42 mg/dL (ref >40)
LDL Cholesterol (Calc): 171 mg/dL (calc) — ABNORMAL HIGH
NON-HDL CHOLESTEROL (CALC): 201 mg/dL — AB (ref <130)
Triglycerides: 152 mg/dL — ABNORMAL HIGH (ref <150)

## 2017-12-31 LAB — NO CULTURE INDICATED

## 2017-12-31 LAB — VITAMIN B12: Vitamin B-12: 582 pg/mL (ref 200–1100)

## 2017-12-31 LAB — VITAMIN D 25 HYDROXY (VIT D DEFICIENCY, FRACTURES): Vit D, 25-Hydroxy: 33 ng/mL (ref 30–100)

## 2017-12-31 LAB — TSH: TSH: 2.2 mIU/L (ref 0.40–4.50)

## 2018-01-01 ENCOUNTER — Other Ambulatory Visit: Payer: Self-pay | Admitting: Adult Health

## 2018-01-01 DIAGNOSIS — E782 Mixed hyperlipidemia: Secondary | ICD-10-CM

## 2018-01-01 DIAGNOSIS — F411 Generalized anxiety disorder: Secondary | ICD-10-CM

## 2018-01-01 MED ORDER — ATORVASTATIN CALCIUM 10 MG PO TABS: 10.0000 mg | ORAL_TABLET | ORAL | 1 refills | Status: DC

## 2018-03-22 ENCOUNTER — Other Ambulatory Visit: Payer: Self-pay | Admitting: Adult Health

## 2018-03-22 DIAGNOSIS — F411 Generalized anxiety disorder: Secondary | ICD-10-CM

## 2018-04-07 ENCOUNTER — Ambulatory Visit: Payer: Self-pay | Admitting: Adult Health

## 2018-05-25 ENCOUNTER — Ambulatory Visit: Payer: Self-pay | Admitting: Adult Health

## 2018-06-21 ENCOUNTER — Other Ambulatory Visit: Payer: Self-pay | Admitting: Internal Medicine

## 2018-06-21 DIAGNOSIS — F411 Generalized anxiety disorder: Secondary | ICD-10-CM

## 2018-07-26 NOTE — Progress Notes (Deleted)
FOLLOW UP  Assessment and Plan:    Cholesterol Currently above goal; newly on atorvastatin 10 mg daily ***, also on omega 3 Continue low cholesterol diet and exercise.  Check lipid panel.   Prediabetes Continue diet and exercise.  Perform daily foot/skin check, notify office of any concerning changes.  Check A1C  Overweight Long discussion about weight loss, diet, and exercise Recommended diet heavy in fruits and veggies and low in animal meats, cheeses, and dairy products, appropriate calorie intake Discussed ideal weight for height (below ***) and initial weight goal (***) Patient will work on *** Will follow up in 3 months  Vitamin D Def Belowgoal at last visit; he *** started supplement Discussed goal of 70-100 Defer Vit D level  GAD Well managed by current regimen; continue medications Stress management techniques discussed, increase water, good sleep hygiene discussed, increase exercise, and increase veggies.    Continue diet and meds as discussed. Further disposition pending results of labs. Discussed med's effects and SE's.   Over 30 minutes of exam, counseling, chart review, and critical decision making was performed.   Future Appointments  Date Time Provider Department Center  07/27/2018  4:30 PM Judd Gaudierorbett, Yesika Rispoli, NP [facility] None  01/05/2019  9:30 AM Judd Gaudierorbett, Zabian Swayne, NP [facility] None    ----------------------------------------------------------------------------------------------------------------------  HPI 42 y.o. male  presents for 3 month follow up on anxiety, cholesterol, prediabetes, weight and vitamin D deficiency.   BMI is There is no height or weight on file to calculate BMI., he {HAS HAS ZOX:09604}OT:18834} been working on diet and exercise. Wt Readings from Last 3 Encounters:  12/30/17 191 lb 9.6 oz (86.9 kg)  05/13/15 189 lb 2 oz (85.8 kg)  02/26/15 195 lb 6.4 oz (88.6 kg)   His blood pressure has typically been well controlled, today their BP is     He {DOES_DOES VWU:98119}OT:18564} workout. He denies chest pain, shortness of breath, dizziness.   He is on cholesterol medication (on omega 3, newly on atorvastatin 10 mg every other day) and denies myalgias. His cholesterol is not at goal. The cholesterol last visit was:   Lab Results  Component Value Date   CHOL 243 (H) 12/30/2017   HDL 42 12/30/2017   LDLCALC 171 (H) 12/30/2017   TRIG 152 (H) 12/30/2017   CHOLHDL 5.8 (H) 12/30/2017    He {Has/has not:18111} been working on diet and exercise for prediabetes, and denies {Symptoms; diabetes w/o none:19199}. Last A1C in the office was:  Lab Results  Component Value Date   HGBA1C 5.7 (H) 12/30/2017   His vitamin D levels were low at last check *** supplement *** Lab Results  Component Value Date   VD25OH 33 12/30/2017     Current Medications:  Current Outpatient Medications on File Prior to Visit  Medication Sig  . atorvastatin (LIPITOR) 10 MG tablet Take 1 tablet (10 mg total) by mouth every other day.  . busPIRone (BUSPAR) 10 MG tablet Take 1/2-1 tab three times a day. Start low and taper up.  . escitalopram (LEXAPRO) 10 MG tablet TAKE 1 TABLET BY MOUTH EVERY DAY  . Magnesium 250 MG TABS Take by mouth 2 (two) times daily.  . Multiple Vitamins-Minerals (MULTIVITAMIN WITH MINERALS) tablet Take 1 tablet by mouth daily.  . Omega-3 Fatty Acids (FISH OIL) 1000 MG CAPS Take by mouth daily.   No current facility-administered medications on file prior to visit.      Allergies:  Allergies  Allergen Reactions  . Celexa [Citalopram Hydrobromide]  No relief  . Pravastatin     Myalgias  . Wellbutrin [Bupropion]     No relief  . Zoloft [Sertraline Hcl]     aggitation     Medical History:  Past Medical History:  Diagnosis Date  . Generalized anxiety disorder 02/27/2015  . Hyperlipidemia   . Vitamin D deficiency    Family history- Reviewed and unchanged Social history- Reviewed and unchanged   Review of Systems:  Review of  Systems  Constitutional: Negative for malaise/fatigue and weight loss.  HENT: Negative for hearing loss and tinnitus.   Eyes: Negative for blurred vision and double vision.  Respiratory: Negative for cough, shortness of breath and wheezing.   Cardiovascular: Negative for chest pain, palpitations, orthopnea, claudication and leg swelling.  Gastrointestinal: Negative for abdominal pain, blood in stool, constipation, diarrhea, heartburn, melena, nausea and vomiting.  Genitourinary: Negative.   Musculoskeletal: Negative for joint pain and myalgias.  Skin: Negative for rash.  Neurological: Negative for dizziness, tingling, sensory change, weakness and headaches.  Endo/Heme/Allergies: Negative for polydipsia.  Psychiatric/Behavioral: Negative.   All other systems reviewed and are negative.     Physical Exam: There were no vitals taken for this visit. Wt Readings from Last 3 Encounters:  12/30/17 191 lb 9.6 oz (86.9 kg)  05/13/15 189 lb 2 oz (85.8 kg)  02/26/15 195 lb 6.4 oz (88.6 kg)   General Appearance: Well nourished, in no apparent distress. Eyes: PERRLA, EOMs, conjunctiva no swelling or erythema Sinuses: No Frontal/maxillary tenderness ENT/Mouth: Ext aud canals clear, TMs without erythema, bulging. No erythema, swelling, or exudate on post pharynx.  Tonsils not swollen or erythematous. Hearing normal.  Neck: Supple, thyroid normal.  Respiratory: Respiratory effort normal, BS equal bilaterally without rales, rhonchi, wheezing or stridor.  Cardio: RRR with no MRGs. Brisk peripheral pulses without edema.  Abdomen: Soft, + BS.  Non tender, no guarding, rebound, hernias, masses. Lymphatics: Non tender without lymphadenopathy.  Musculoskeletal: Full ROM, 5/5 strength, {PSY - GAIT AND STATION:22860} gait Skin: Warm, dry without rashes, lesions, ecchymosis.  Neuro: Cranial nerves intact. No cerebellar symptoms.  Psych: Awake and oriented X 3, normal affect, Insight and Judgment  appropriate.    [name], NP 5:35 PM [facility]

## 2018-07-27 ENCOUNTER — Ambulatory Visit: Payer: Self-pay | Admitting: Adult Health

## 2018-08-14 DIAGNOSIS — T1502XA Foreign body in cornea, left eye, initial encounter: Secondary | ICD-10-CM | POA: Diagnosis not present

## 2018-08-14 DIAGNOSIS — H10013 Acute follicular conjunctivitis, bilateral: Secondary | ICD-10-CM | POA: Diagnosis not present

## 2018-09-14 DIAGNOSIS — F1021 Alcohol dependence, in remission: Secondary | ICD-10-CM | POA: Insufficient documentation

## 2018-09-14 DIAGNOSIS — F17200 Nicotine dependence, unspecified, uncomplicated: Secondary | ICD-10-CM | POA: Insufficient documentation

## 2018-09-14 DIAGNOSIS — F1010 Alcohol abuse, uncomplicated: Secondary | ICD-10-CM

## 2018-09-14 HISTORY — DX: Alcohol abuse, uncomplicated: F10.10

## 2018-09-14 NOTE — Progress Notes (Signed)
FOLLOW UP  Assessment and Plan:   Cholesterol Currently above goal; atorvastatin 10 mg daily - started then stopped Continue low cholesterol diet and exercise.  Check lipid panel.   Prediabetes Continue diet and exercise.  Perform daily foot/skin check, notify office of any concerning changes.  Check A1C  Overweight Long discussion about weight loss, diet, and exercise Recommended diet heavy in fruits and veggies and low in animal meats, cheeses, and dairy products, appropriate calorie intake Discussed ideal weight for height  Patient will work on cutting down on meat intake, portions, highly processed foods Will follow up in 3 months  Vitamin D Def Below goal at last visit; he has not initiated supplementation continue supplementation to maintain goal of 70-100 Defer Vit D level  Anxiety Off medications and reportedly doing well  Stress management techniques discussed, increase water, good sleep hygiene discussed, increase exercise, and increase veggies.   Tobacco use Discussed risks associated with tobacco use and advised to reduce or quit Patient is not ready to do so, but advised to consider strongly Will follow up at the next visit  Binge drinking Patient reports not longer binge drinking; down to occasional beer and doing well Requesting alcohol abuse resources - information provided on AVS   Continue diet and meds as discussed. Further disposition pending results of labs. Discussed med's effects and SE's.   Over 30 minutes of exam, counseling, chart review, and critical decision making was performed.   Future Appointments  Date Time Provider Department Center  01/05/2019  9:30 AM [name], NP [facility] None    ----------------------------------------------------------------------------------------------------------------------  HPI 42 y.o. male, married with 4 children, presents for 6 month follow up on cholesterol, prediabetes, weight, smoking/bringe  drinking, anxiety and vitamin D deficiency.   He has been prescribed celexa, zoloft, wellbutrin - he reports he had sides effects with these - he had moodiness/felt mean, some caused difficulty sleeping. We started lexapro and buspar, which he reports helped for several months, then was feeling good so tapered off and still doing well off of medications.   He reported he has been drinking 1-2 liters of whisky weekly over the past year. He reports he has cut way back, barely drinking at all. Not on any routine basis. Will have an occasional beer.   he currently continues to smoke 0.5 pack a day; discussed risks associated with smoking, patient is not ready to quit at this time, but is interested in doing so in the future with awareness of risks.   BMI is Body mass index is 29.86 kg/m., he has not been working on diet and exercise; he reports he is active during the day and rarely sits, works a physically intense job.  Wt Readings from Last 3 Encounters:  09/15/18 196 lb 6.4 oz (89.1 kg)  12/30/17 191 lb 9.6 oz (86.9 kg)  05/13/15 189 lb 2 oz (85.8 kg)   Today their BP is BP: 104/76  He does not workout. He denies chest pain, shortness of breath, dizziness.   He is not on cholesterol medication - was prescribed atorvastatin 10 mg and omega 3 but stopped a few months ago. He denies having myalgias on medication. His cholesterol is not at goal. The cholesterol last visit was:   Lab Results  Component Value Date   CHOL 243 (H) 12/30/2017   HDL 42 12/30/2017   LDLCALC 171 (H) 12/30/2017   TRIG 152 (H) 12/30/2017   CHOLHDL 5.8 (H) 12/30/2017    He has not been  working on diet and exercise for prediabetes, and denies hyperglycemia, increased appetite, nausea, paresthesia of the feet, polydipsia and polyuria. Last A1C in the office was:  Lab Results  Component Value Date   HGBA1C 5.7 (H) 12/30/2017   Patient is on Vitamin D supplement.   Lab Results  Component Value Date   VD25OH 33  12/30/2017       Current Medications:  Current Outpatient Medications on File Prior to Visit  Medication Sig  . atorvastatin (LIPITOR) 10 MG tablet Take 1 tablet (10 mg total) by mouth every other day. (Patient not taking: Reported on 09/15/2018)  . busPIRone (BUSPAR) 10 MG tablet Take 1/2-1 tab three times a day. Start low and taper up. (Patient not taking: Reported on 09/15/2018)  . escitalopram (LEXAPRO) 10 MG tablet TAKE 1 TABLET BY MOUTH EVERY DAY (Patient not taking: Reported on 09/15/2018)  . Magnesium 250 MG TABS Take by mouth 2 (two) times daily.  . Multiple Vitamins-Minerals (MULTIVITAMIN WITH MINERALS) tablet Take 1 tablet by mouth daily.  . Omega-3 Fatty Acids (FISH OIL) 1000 MG CAPS Take by mouth daily.   No current facility-administered medications on file prior to visit.      Allergies:  Allergies  Allergen Reactions  . Celexa [Citalopram Hydrobromide]     No relief  . Pravastatin     Myalgias  . Wellbutrin [Bupropion]     No relief  . Zoloft [Sertraline Hcl]     aggitation     Medical History:  Past Medical History:  Diagnosis Date  . Generalized anxiety disorder 02/27/2015  . Hyperlipidemia   . Vitamin D deficiency    Family history- Reviewed and unchanged Social history- Reviewed and unchanged   Review of Systems:  Review of Systems  Constitutional: Negative for malaise/fatigue and weight loss.  HENT: Negative for hearing loss and tinnitus.   Eyes: Negative for blurred vision and double vision.  Respiratory: Negative for cough, shortness of breath and wheezing.   Cardiovascular: Negative for chest pain, palpitations, orthopnea, claudication and leg swelling.  Gastrointestinal: Negative for abdominal pain, blood in stool, constipation, diarrhea, heartburn, melena, nausea and vomiting.  Genitourinary: Negative.   Musculoskeletal: Negative for joint pain and myalgias.  Skin: Negative for rash.  Neurological: Negative for dizziness, tingling, sensory  change, weakness and headaches.  Endo/Heme/Allergies: Negative for polydipsia.  Psychiatric/Behavioral: Positive for substance abuse. Negative for depression, hallucinations and suicidal ideas. The patient is not nervous/anxious and does not have insomnia.   All other systems reviewed and are negative.    Physical Exam: BP 104/76   Pulse (!) 52   Temp (!) 97.5 F (36.4 C)   Ht 5\' 8"  (1.727 m)   Wt 196 lb 6.4 oz (89.1 kg)   SpO2 98%   BMI 29.86 kg/m  Wt Readings from Last 3 Encounters:  09/15/18 196 lb 6.4 oz (89.1 kg)  12/30/17 191 lb 9.6 oz (86.9 kg)  05/13/15 189 lb 2 oz (85.8 kg)   General Appearance: Well nourished, in no apparent distress. Eyes: PERRLA, EOMs, conjunctiva no swelling or erythema Sinuses: No Frontal/maxillary tenderness ENT/Mouth: Ext aud canals clear, TMs without erythema, bulging. No erythema, swelling, or exudate on post pharynx.  Tonsils not swollen or erythematous. Hearing normal.  Neck: Supple, thyroid normal.  Respiratory: Respiratory effort normal, BS equal bilaterally without rales, rhonchi, wheezing or stridor.  Cardio: RRR with no MRGs. Brisk peripheral pulses without edema.  Abdomen: Soft, + BS.  Non tender, no guarding, rebound, hernias, masses. Lymphatics:  Non tender without lymphadenopathy.  Musculoskeletal: Full ROM, 5/5 strength, Normal gait Skin: Warm, dry without rashes, lesions, ecchymosis.  Neuro: Cranial nerves intact. No cerebellar symptoms.  Psych: Awake and oriented X 3, normal affect, Insight and Judgment appropriate.    [name], NP 10:07 AM [facility]

## 2018-09-15 ENCOUNTER — Encounter: Payer: Self-pay | Admitting: Adult Health

## 2018-09-15 ENCOUNTER — Ambulatory Visit: Payer: 59 | Admitting: Adult Health

## 2018-09-15 VITALS — BP 104/76 | HR 52 | Temp 97.5°F | Ht 68.0 in | Wt 196.4 lb

## 2018-09-15 DIAGNOSIS — E782 Mixed hyperlipidemia: Secondary | ICD-10-CM

## 2018-09-15 DIAGNOSIS — R7303 Prediabetes: Secondary | ICD-10-CM | POA: Diagnosis not present

## 2018-09-15 DIAGNOSIS — E663 Overweight: Secondary | ICD-10-CM | POA: Diagnosis not present

## 2018-09-15 DIAGNOSIS — E559 Vitamin D deficiency, unspecified: Secondary | ICD-10-CM

## 2018-09-15 DIAGNOSIS — F1010 Alcohol abuse, uncomplicated: Secondary | ICD-10-CM

## 2018-09-15 DIAGNOSIS — F17200 Nicotine dependence, unspecified, uncomplicated: Secondary | ICD-10-CM

## 2018-09-15 DIAGNOSIS — F411 Generalized anxiety disorder: Secondary | ICD-10-CM | POA: Diagnosis not present

## 2018-09-15 DIAGNOSIS — Z79899 Other long term (current) drug therapy: Secondary | ICD-10-CM

## 2018-09-15 NOTE — Patient Instructions (Addendum)
Goals    . LDL CALC < 100    . Reduce/quit smoking       Counseling services  Here are some numbers below you can try but I suggest calling your insurance and finding out who is in your network and THEN calling those people or looking them up on google.   I'm a big fan of Cognitive Behavioral Therapy, look this up on You tube or check with the therapist you see if they are certified.  This form of therapy helps to teach you skills to better handle with current situation that are causing anxiety or depression.   [facility] [place] [id number] [address]  [phone] Fax [phone]  [facility] Hours: Monday-Thursday 830-8pm  Friday 830AM-7PM Address: [address] [phone]  [facility].  Address: [address] [place], [place] [address] [phone]  [facility] [phone] office [email or web address] [address] [place], [place], [place] [address]  Dr. Vilinda Flake, Ph.D. [address] [place] [address] Phone: [phone]  Catharine Dowda, [place] [phone] B. [address] [place] [address]   Franchot Erichsen, MA, clinical psychologist  Cognitive-Behavior Therapy; Mood Disorders; Anxiety Disorders; adult and child ADHD; Family Therapy; Stress Management; personal growth, and Marital Therapy.    Carlus Pavlov Ph.D., clinical psychologist Cognitive-Behavior Therapy; Mood Disorders; Anxiety Disorders; Stress     Management  [facility] [address], [place], [place] [address] [phone]   [facility] Sheran Luz, psychotherapist [address] [place], [place] [address] [phone]  Miguel Aschoff Ph.D., clinical psychologist [phone] office [address] [place], [place] [address] Cognitive Behavior Therapy, Depression, Bipolar, Anxiety, Grief and  Loss       Preventing High Cholesterol Cholesterol is a waxy, fat-like substance that your body needs in small amounts. Your liver makes all the cholesterol that your body needs. Having high cholesterol (hypercholesterolemia) increases your risk for heart disease and stroke. Extra (excess) cholesterol comes from the food you eat, such as animal-based fat (saturated fat) from meat and some dairy products. High cholesterol can often be prevented with diet and lifestyle changes. If you already have high cholesterol, you can control it with diet and lifestyle changes, as well as medicine. What nutrition changes can be made?  Eat less saturated fat. Foods that contain saturated fat include red meat and some dairy products.  Avoid processed meats, like bacon and lunch meats.  Avoid trans fats, which are found in margarine and some baked goods.  Avoid foods and beverages that have added sugars.  Eat more fruits, vegetables, and whole grains.  Choose healthy sources of protein, such as fish, poultry, and nuts.  Choose healthy sources of fat, such as: ? Nuts. ? Vegetable oils, especially olive oil. ? Fish that have healthy fats (omega-3 fatty acids), such as mackerel or salmon. What lifestyle changes can be made?  Lose weight if you are overweight. Losing 5-10 lb (2.3-4.5 kg) can help prevent or control high cholesterol and reduce your risk for diabetes and high blood pressure. Ask your health care provider to help you with a diet and exercise plan to safely lose weight.  Get enough exercise. Do at least 150 minutes of moderate-intensity exercise each week. ? You could do this in short exercise sessions several times a day, or you could do longer exercise sessions a few times a week. For example, you  could take a brisk 10-minute walk or bike ride, 3 times a day, for 5 days a week.  Do not smoke. If you need help quitting, ask your health care provider.  Limit your alcohol intake. If you  drink alcohol, limit alcohol intake to no more than 1 drink a day for nonpregnant women and 2 drinks a day for men. One drink equals 12 oz of beer, 5 oz of wine, or 1 oz of hard liquor. Why are these changes important? If you have high cholesterol, deposits (plaques) may build up on the walls of your blood vessels. Plaques make the arteries narrower and stiffer, which can restrict or block blood flow and cause blood clots to form. This greatly increases your risk for heart attack and stroke. Making diet and lifestyle changes can reduce your risk for these life-threatening conditions. What can I do to lower my risk?  Manage your risk factors for high cholesterol. Talk with your health care provider about all of your risk factors and how to lower your risk.  Manage other conditions that you have, such as diabetes or high blood pressure (hypertension).  Have your cholesterol checked at regular intervals.  Keep all follow-up visits as told by your health care provider. This is important. How is this treated? In addition to diet and lifestyle changes, your health care provider may recommend medicines to help lower cholesterol, such as a medicine to reduce the amount of cholesterol made in your liver. You may need medicine if:  Diet and lifestyle changes do not lower your cholesterol enough.  You have high cholesterol and other risk factors for heart disease or stroke.  Take over-the-counter and prescription medicines only as told by your health care provider. Where to find more information:  American Heart Association: 1122334455.jsp  National Heart, Lung, and Blood Institute: http://hood.com/ Summary  High cholesterol increases your risk for heart disease and stroke. By keeping your cholesterol level low, you can reduce your risk for these conditions.  Diet and  lifestyle changes are the most important steps in preventing high cholesterol.  Work with your health care provider to manage your risk factors, and have your blood tested regularly. This information is not intended to replace advice given to you by your health care provider. Make sure you discuss any questions you have with your health care provider. Document Released: 11/30/2015 Document Revised: 07/24/2016 Document Reviewed: 07/24/2016 Elsevier Interactive Patient Education  2018 [employer].     Steps to Quit Smoking Smoking tobacco can be harmful to your health and can affect almost every organ in your body. Smoking puts you, and those around you, at risk for developing many serious chronic diseases. Quitting smoking is difficult, but it is one of the best things that you can do for your health. It is never too late to quit. What are the benefits of quitting smoking? When you quit smoking, you lower your risk of developing serious diseases and conditions, such as:  Lung cancer or lung disease, such as COPD.  Heart disease.  Stroke.  Heart attack.  Infertility.  Osteoporosis and bone fractures.  Additionally, symptoms such as coughing, wheezing, and shortness of breath may get better when you quit. You may also find that you get sick less often because your body is stronger at fighting off colds and infections. If you are pregnant, quitting smoking can help to reduce your chances of having a baby of low birth weight. How do I get ready to quit? When you  decide to quit smoking, create a plan to make sure that you are successful. Before you quit:  Pick a date to quit. Set a date within the next two weeks to give you time to prepare.  Write down the reasons why you are quitting. Keep this list in places where you will see it often, such as on your bathroom mirror or in your car or wallet.  Identify the people, places, things, and activities that make you want to smoke (triggers)  and avoid them. Make sure to take these actions: ? Throw away all cigarettes at home, at work, and in your car. ? Throw away smoking accessories, such as Set designer. ? Clean your car and make sure to empty the ashtray. ? Clean your home, including curtains and carpets.  Tell your family, friends, and coworkers that you are quitting. Support from your loved ones can make quitting easier.  Talk with your health care provider about your options for quitting smoking.  Find out what treatment options are covered by your health insurance.  What strategies can I use to quit smoking? Talk with your healthcare provider about different strategies to quit smoking. Some strategies include:  Quitting smoking altogether instead of gradually lessening how much you smoke over a period of time. Research shows that quitting "cold [place]" is more successful than gradually quitting.  Attending in-person counseling to help you build problem-solving skills. You are more likely to have success in quitting if you attend several counseling sessions. Even short sessions of 10 minutes can be effective.  Finding resources and support systems that can help you to quit smoking and remain smoke-free after you quit. These resources are most helpful when you use them often. They can include: ? Online chats with a Veterinary surgeon. ? Telephone quitlines. ? Automotive engineer. ? Support groups or group counseling. ? Text messaging programs. ? Mobile phone applications.  Taking medicines to help you quit smoking. (If you are pregnant or breastfeeding, talk with your health care provider first.) Some medicines contain nicotine and some do not. Both types of medicines help with cravings, but the medicines that include nicotine help to relieve withdrawal symptoms. Your health care provider may recommend: ? Nicotine patches, gum, or lozenges. ? Nicotine inhalers or sprays. ? Non-nicotine medicine that is taken by  mouth.  Talk with your health care provider about combining strategies, such as taking medicines while you are also receiving in-person counseling. Using these two strategies together makes you more likely to succeed in quitting than if you used either strategy on its own. If you are pregnant or breastfeeding, talk with your health care provider about finding counseling or other support strategies to quit smoking. Do not take medicine to help you quit smoking unless told to do so by your health care provider. What things can I do to make it easier to quit? Quitting smoking might feel overwhelming at first, but there is a lot that you can do to make it easier. Take these important actions:  Reach out to your family and friends and ask that they support and encourage you during this time. Call telephone quitlines, reach out to support groups, or work with a counselor for support.  Ask people who smoke to avoid smoking around you.  Avoid places that trigger you to smoke, such as bars, parties, or smoke-break areas at work.  Spend time around people who do not smoke.  Lessen stress in your life, because stress can be a smoking  trigger for some people. To lessen stress, try: ? Exercising regularly. ? Deep-breathing exercises. ? Yoga. ? Meditating. ? Performing a body scan. This involves closing your eyes, scanning your body from head to toe, and noticing which parts of your body are particularly tense. Purposefully relax the muscles in those areas.  Download or purchase mobile phone or tablet apps (applications) that can help you stick to your quit plan by providing reminders, tips, and encouragement. There are many free apps, such as QuitGuide from the [employer] developer for Disease Control and Prevention). You can find other support for quitting smoking (smoking cessation) through smokefree.gov and other websites.  How will I feel when I quit smoking? Within the first 24 hours of quitting smoking,  you may start to feel some withdrawal symptoms. These symptoms are usually most noticeable 2-3 days after quitting, but they usually do not last beyond 2-3 weeks. Changes or symptoms that you might experience include:  Mood swings.  Restlessness, anxiety, or irritation.  Difficulty concentrating.  Dizziness.  Strong cravings for sugary foods in addition to nicotine.  Mild weight gain.  Constipation.  Nausea.  Coughing or a sore throat.  Changes in how your medicines work in your body.  A depressed mood.  Difficulty sleeping (insomnia).  After the first 2-3 weeks of quitting, you may start to notice more positive results, such as:  Improved sense of smell and taste.  Decreased coughing and sore throat.  Slower heart rate.  Lower blood pressure.  Clearer skin.  The ability to breathe more easily.  Fewer sick days.  Quitting smoking is very challenging for most people. Do not get discouraged if you are not successful the first time. Some people need to make many attempts to quit before they achieve long-term success. Do your best to stick to your quit plan, and talk with your health care provider if you have any questions or concerns. This information is not intended to replace advice given to you by your health care provider. Make sure you discuss any questions you have with your health care provider. Document Released: 11/09/2001 Document Revised: 07/13/2016 Document Reviewed: 04/01/2015 Elsevier Interactive Patient Education  [employer].

## 2018-09-16 LAB — COMPLETE METABOLIC PANEL WITH GFR
AG Ratio: 1.6 (calc) (ref 1.0–2.5)
ALBUMIN MSPROF: 4.4 g/dL (ref 3.6–5.1)
ALT: 20 U/L (ref 9–46)
AST: 20 U/L (ref 10–40)
Alkaline phosphatase (APISO): 79 U/L (ref 40–115)
BUN: 13 mg/dL (ref 7–25)
CHLORIDE: 104 mmol/L (ref 98–110)
CO2: 28 mmol/L (ref 20–32)
Calcium: 10 mg/dL (ref 8.6–10.3)
Creat: 0.85 mg/dL (ref 0.60–1.35)
GFR, EST AFRICAN AMERICAN: 125 mL/min/{1.73_m2} (ref >=60)
GFR, Est Non African American: 107 mL/min/{1.73_m2} (ref >=60)
GLOBULIN: 2.7 g/dL (ref 1.9–3.7)
GLUCOSE: 82 mg/dL (ref 65–99)
Potassium: 4.8 mmol/L (ref 3.5–5.3)
SODIUM: 139 mmol/L (ref 135–146)
TOTAL PROTEIN: 7.1 g/dL (ref 6.1–8.1)
Total Bilirubin: 0.5 mg/dL (ref 0.2–1.2)

## 2018-09-16 LAB — TSH: TSH: 2.3 m[IU]/L (ref 0.40–4.50)

## 2018-09-16 LAB — LIPID PANEL
CHOL/HDL RATIO: 5.5 (calc) — AB (ref <5.0)
Cholesterol: 231 mg/dL — ABNORMAL HIGH (ref <200)
HDL: 42 mg/dL (ref >40)
LDL CHOLESTEROL (CALC): 163 mg/dL — AB
Non-HDL Cholesterol (Calc): 189 mg/dL (calc) — ABNORMAL HIGH (ref <130)
Triglycerides: 135 mg/dL (ref <150)

## 2018-09-16 LAB — CBC WITH DIFFERENTIAL/PLATELET
BASOS ABS: 39 {cells}/uL (ref 0–200)
BASOS PCT: 0.4 %
EOS ABS: 176 {cells}/uL (ref 15–500)
Eosinophils Relative: 1.8 %
HEMATOCRIT: 45.4 % (ref 38.5–50.0)
HEMOGLOBIN: 15.5 g/dL (ref 13.2–17.1)
Lymphs Abs: 3312 cells/uL (ref 850–3900)
MCH: 31.1 pg (ref 27.0–33.0)
MCHC: 34.1 g/dL (ref 32.0–36.0)
MCV: 91.2 fL (ref 80.0–100.0)
MONOS PCT: 12.9 %
MPV: 11.5 fL (ref 7.5–12.5)
NEUTROS ABS: 5008 {cells}/uL (ref 1500–7800)
Neutrophils Relative %: 51.1 %
Platelets: 213 10*3/uL (ref 140–400)
RBC: 4.98 10*6/uL (ref 4.20–5.80)
RDW: 12.3 % (ref 11.0–15.0)
Total Lymphocyte: 33.8 %
WBC mixed population: 1264 cells/uL — ABNORMAL HIGH (ref 200–950)
WBC: 9.8 10*3/uL (ref 3.8–10.8)

## 2018-09-16 LAB — VITAMIN D 25 HYDROXY (VIT D DEFICIENCY, FRACTURES): VIT D 25 HYDROXY: 27 ng/mL — AB (ref 30–100)

## 2018-09-16 LAB — HEMOGLOBIN A1C
EAG (MMOL/L): 6.5 (calc)
Hgb A1c MFr Bld: 5.7 % of total Hgb — ABNORMAL HIGH (ref <5.7)
MEAN PLASMA GLUCOSE: 117 (calc)

## 2018-09-18 ENCOUNTER — Other Ambulatory Visit: Payer: Self-pay | Admitting: Adult Health

## 2018-12-07 ENCOUNTER — Ambulatory Visit: Payer: 59 | Admitting: Adult Health

## 2018-12-07 ENCOUNTER — Telehealth: Payer: Self-pay

## 2018-12-07 ENCOUNTER — Encounter: Payer: Self-pay | Admitting: Adult Health

## 2018-12-07 VITALS — BP 124/90 | HR 80 | Temp 97.5°F | Ht 68.0 in | Wt 191.0 lb

## 2018-12-07 DIAGNOSIS — F1010 Alcohol abuse, uncomplicated: Secondary | ICD-10-CM

## 2018-12-07 NOTE — Telephone Encounter (Signed)
Patient was seen today and was told to follow up about plans for rehab. Patient states that he will be attending the [facility] in [place] for a 4 week inpatient stay. He will contact us if he needs [place] to fill out paperwork for short term disability.

## 2018-12-07 NOTE — Progress Notes (Signed)
Assessment and Plan:  Tracy Barrett was seen today for addiction problem.  Diagnoses and all orders for this visit:  Alcohol consumption binge drinking AUDIT score of 18 Discussed local resources at length; he does not appear to be at risk for withdrawals due to infrequent/binge-type drinking behaviors, question whether 16 week rehab/commitment is necessary and he is in agreement that this would cause undue stress due to his family situation. Recommended he continue with local counseling, information provided for local intensive outpatient treatment centers and local resource call lines. Recommended he look back into AA, getting a local accountability sponsor. He will call the suggested resources, call back with any further questions or needs and we will be happy to support him through this process. I am happy to assist with certifying short term disability if commitment/full rehab is recommended to him by local alcoholism management centers.    Over 30 minutes of exam, counseling, chart review, and critical decision making was performed.   Future Appointments  Date Time Provider Department Center  01/05/2019  9:30 AM Tracy Gaudier, NP [facility] None    ------------------------------------------------------------------------------------------------------------------   HPI BP 124/90   Pulse 80   Temp (!) 97.5 F (36.4 C)   Ht 5\' 8"  (1.727 m)   Wt 191 lb (86.6 kg)   SpO2 98%   BMI 29.04 kg/m   43 y.o.male with hx of traumatic leg injury, GAD, alcohol binge drinking presents for discussion that he has regarding getting into rehab for alcohol addiction. He has had alcohol binge drinking problems since he started drinking at age 43. He reports he used to start drinking as soon as he got off of work, continue to drink until he passed out, was drinking beer, 8-10 beers + some hard liquor that went on for several years. He reports he got married and once children arrived he had an eye-opening moment  and was able to cut back significantly and wasn't drinking everyday at that point. He reports he would still drink on the weekends or at a party or cookout, would drink a pint of liquor. He reports this gradually increased over the past few years, was drinking a 5th of liquor every day until recently when he attempted to quit and was initially doing well. However he reports since the new years he has been struggling to avoid alcohol, had several days where he picked up a lot of alcohol, drove to the beach and didn't go to work. He recognizes this as a problem and wants to get better.   [facility] in [place], [place], has been discussing being committed for rehab. He is currently reporting to them every day to check in. As of yesterday, there was a plan for him to do a medically supervised 5 day detox then on waiting list to see if he is eligible for upcoming bed availability 12/16/2018. He is concerned that 16 week commitment/detox is not ideal for his situation and asking about resources locally.   He has speaking with [facility], Scientist, clinical (histocompatibility and immunogenetics) (counsellor), 4-5 sessions  AUDIT tool scores 18  Past Medical History:  Diagnosis Date  . Generalized anxiety disorder 02/27/2015  . Hyperlipidemia   . Vitamin D deficiency      Allergies  Allergen Reactions  . Celexa [Citalopram Hydrobromide]     No relief  . Pravastatin     Myalgias  . Wellbutrin [Bupropion]     No relief  . Zoloft [Sertraline Hcl]     aggitation  Current Outpatient Medications on File Prior to Visit  Medication Sig  . atorvastatin (LIPITOR) 10 MG tablet Take 1 tablet (10 mg total) by mouth every other day.  . Magnesium 250 MG TABS Take by mouth 2 (two) times daily.  . Multiple Vitamins-Minerals (MULTIVITAMIN WITH MINERALS) tablet Take 1 tablet by mouth daily.  . Omega-3 Fatty Acids (FISH OIL) 1000 MG CAPS Take by mouth daily.   No current facility-administered medications on file prior to  visit.     ROS: all negative except above.   Physical Exam:  BP 124/90   Pulse 80   Temp (!) 97.5 F (36.4 C)   Ht 5\' 8"  (1.727 m)   Wt 191 lb (86.6 kg)   SpO2 98%   BMI 29.04 kg/m   General Appearance: Well nourished, in no apparent distress. Eyes: PERRLA, conjunctiva no swelling or erythema ENT/Mouth: No erythema, swelling, or exudate on post pharynx.  Tonsils not swollen or erythematous. Hearing normal.  Neck: Supple, thyroid normal.  Respiratory: Respiratory effort normal, BS equal bilaterally without rales, rhonchi, wheezing or stridor.  Cardio: RRR with no MRGs. Brisk peripheral pulses without edema.  Abdomen: Soft, + BS.  Non tender, no guarding, rebound, hernias, masses. Lymphatics: Non tender without lymphadenopathy.  Musculoskeletal: Symmetrical strength, normal gait.  Skin: Warm, dry without rashes, lesions, ecchymosis.  Neuro: Cranial nerves intact. Normal muscle tone, no cerebellar symptoms. Sensation intact.  Psych: Awake and oriented X 3, normal affect, Insight and Judgment appropriate.     Tracy MakerAshley C Dazia Lippold, NP 12:44 PM [facility]

## 2018-12-07 NOTE — Patient Instructions (Signed)
Behavioral Health Resources in the [facility]  Intensive Outpatient Programs:  [facility]  [address]  [place], [place]  [phone]  Both a day and evening program  [facility] Outpatient  [address]  [place], [place] [address]  [phone]  [facility]: [facility]  [address]  [place]  [phone]  [facility]  ACCESS LINE: [phone] or [phone]  [address]  [place], [place] [address]  [email or web address]  Substance Abuse Resources:  [facility] Drug [facility] [phone]  [facility] [phone]  [facility] [phone]  Floydene Flock [phone]  Residential & Outpatient Substance Abuse Program [phone] Psychological Services:  [facility] [phone]  [facility] [phone]  [place], [place]. [address], [place], ACCESS LINE: [phone] or [phone], [email or web address] Mobile Crisis Teams:  [facility]  Mobile Crisis Care Unit  [phone]  Assertive  Psychotherapeutic Services  [address] [place]  [phone]  Interventionist  [address] [place]  [address]  [place]  [phone]  Self-Help/Support Groups:  [facility] [employer] of support groups  [phone] (call for more info)  Narcotics Anonymous (NA)  [facility]  [address]  [place] - 2 meetings at this location  Residential Treatment Programs:  [facility]  [address]  [place]  [phone]  [facility]  [address], [place] [address]  [place], [place] [address]  [phone]  [facility]  [address] [place], [place] [address]  [phone]  Admissions: 8am-3pm M-F  [facility]  [address]  [place], [place] [address]  [phone]  [facility]  [address] [place]  [place], [place]  [phone]  [facility]  [address]  [place], [place]  [phone]  [facility] - Intensive Outpatient  [address]  [place], [place]  [phone]  [facility] ([facility]  [address]  [place], [place]  [phone] or [phone]  Residential Treatment Services (RTS), Medicaid  [address]  [place], [place]  [phone]  Fellowship [address]  [address]  [place]  [phone]  [facility] Resources:  [phone]  General Therapy  Angie Fava, PhD  [address] [place], [place] [address]  [phone]  Insurance  [facility]  [address]  [place], [place] [address]  [phone]  [facility]  [address] [place], [place] [address]  [phone]  Insurance/Medicaid/sponsorship through [facility]  [address]  [place], [place] [address]  Therapy/tele-psych/case  [phone]  [facility]  [address], [place] [address]  Adolescent/group home/case management  [phone]  Creola Corn PhD  General therapy  Insurance  [phone]  Dr. Lolly Mustache, [place], M-F  [phone]  [facility] [employer] [facility] Dept.  [address] [place] [address]  Blondell Reveal  Phone: [phone] Phone: [phone] Phone: [phone]  [facility], [phone]  [facility] [place]- [phone] - [facility] in [place], [address],  [phone], Insurance  [place] Child Abuse Hotline  [phone] or [phone] (After Hours)

## 2019-01-05 ENCOUNTER — Encounter: Payer: Self-pay | Admitting: Adult Health

## 2019-01-10 NOTE — Progress Notes (Signed)
Complete Physical  Assessment and Plan:  Diagnoses and all orders for this visit:  Encounter for routine adult physical exam with abnormal findings  Hyperlipidemia, unspecified hyperlipidemia type At last check above goal; has been treated thus far with omega 3 and red yeast rice supplement, was prescribed atorvastatin but stopped prior to rehab, apparently had cholesterol checked and was normal Continue low cholesterol diet and exercise.  Check lipid panel.  -     Lipid panel -     TSH  Vitamin D deficiency Continue supplementation as needed for goal of 70-100 -     VITAMIN D 25 Hydroxy (Vit-D Deficiency, Fractures)  Traumatic injury of right lower extremity, sequela Remote; no issues  Generalized anxiety disorder Symptoms improved since quitting alcohol; monitor  Stress management techniques discussed, increase water, good sleep hygiene discussed, increase exercise, and increase veggies.   Screening for cardiovascular condition -     EKG 12-Lead  Screening for hematuria or proteinuria -     Urinalysis w microscopic + reflex cultur  Medication management -     CBC with Differential/Platelet -     CMP/GFR -     Magnesium   Prediabetes Discussed disease and risks Discussed diet/exercise, weight management  -     Hemoglobin A1c  Alcohol abuse Sober since 12/04/2018 [facility], doing AA nearly daily Continue naltrexone 50 mg daily He feels very positive and confident to maintain  Overweight (BMI 25.0-29.9) Long discussion about weight loss, diet, and exercise Recommended diet heavy in fruits and veggies and low in animal meats, cheeses, and dairy products, appropriate calorie intake Discussed appropriate weight for height  Follow up at 3 months  Tobacco use Discussed risks associated with tobacco use and advised to reduce or quit Patient is ready to do so and plans to taper slowly, use nicotine gum if needed Declines Chantix or other  medication Will follow up at the next visit   Discussed med's effects and SE's. Screening labs and tests as requested with regular follow-up as recommended. Over 40 minutes of exam, counseling, chart review and critical decision making was performed  Future Appointments  Date Time Provider Department Center  01/14/2020 10:00 AM [name], NP [facility] None     HPI BP 110/70   Pulse 64   Temp (!) 97.3 F (36.3 C)   Ht 5\' 8"  (1.727 m)   Wt 202 lb (91.6 kg)   SpO2 97%   BMI 30.71 kg/m   Patient is a 43 y/o Caucasian married male, married with 4 children, 48-40 years old, 2 boys 2 girls, works with [employer] who presents for a complete physical. has Hyperlipidemia; Vitamin D deficiency; Traumatic leg injury; Generalized anxiety disorder; Prediabetes; Overweight (BMI 25.0-29.9); Alcohol consumption binge drinking; and Smoker on their problem list.   He has hx of alcohol binge- drinking and recently was admitted to inpatient 4 week intensive treatment at [facility] in [place]. He reports he is doing very well after this, strongly recommends program, doing AA nearly daily, has been sober since 12/04/2018. He is on naltrexone 50 mg and feels he is doing well with this. Had anxiety, didn't tolerate medications well but denies problems since quitting alcohol.   he currently continues to smoke 0.5 pack a day; discussed risks associated with smoking, patient is ready to quit at this time, but is interested in doing so in the future with awareness of risks.   BMI is Body mass index is 30.71 kg/m., he  has been working on diet and exercise, he is very active with job, plays sports, lifts weights daily. He does make sure to eat fruits and salads, avoids fried foods, limited bread. He is cutting back on sugar intake, switching to black coffee, does drink a pot a day. He drinks 20 ounces of water x 3-4 times daily. He gained up to 220 lb in rehab, is in the process of losing  weight.  Wt Readings from Last 3 Encounters:  01/11/19 202 lb (91.6 kg)  12/07/18 191 lb (86.6 kg)  09/15/18 196 lb 6.4 oz (89.1 kg)   Today their BP is BP: 110/70 He does not workout, but is at a physically intense job. He denies chest pain, shortness of breath, dizziness.   He is not on cholesterol medication, was on pravastatin and had myalgias. He reports he had nutrition class while in rehab and had checked reports was "good" "around 100."  His cholesterol is not at goal. The cholesterol last visit was:   Lab Results  Component Value Date   CHOL 231 (H) 09/15/2018   HDL 42 09/15/2018   LDLCALC 163 (H) 09/15/2018   TRIG 135 09/15/2018   CHOLHDL 5.5 (H) 09/15/2018   He has not been working on diet and exercise for prediabetes, he is not on bASA, he is not on ACE/ARB and denies increased appetite, nausea, paresthesia of the feet, polydipsia, polyuria, visual disturbances, vomiting and weight loss. Last A1C in the office was:  Lab Results  Component Value Date   HGBA1C 5.7 (H) 09/15/2018   Last GFR: Lab Results  Component Value Date   GFRNONAA 107 09/15/2018   Patient is not on Vitamin D supplement and low at last check:    Lab Results  Component Value Date   VD25OH 27 (L) 09/15/2018     Last PSA was: Lab Results  Component Value Date   PSA 0.47 11/27/2013    Current Medications:  Current Outpatient Medications on File Prior to Visit  Medication Sig Dispense Refill  . Magnesium 250 MG TABS Take by mouth 2 (two) times daily.    . Multiple Vitamins-Minerals (MULTIVITAMIN WITH MINERALS) tablet Take 1 tablet by mouth daily.    . naltrexone (DEPADE) 50 MG tablet Take by mouth daily.    . Omega-3 Fatty Acids (FISH OIL) 1000 MG CAPS Take by mouth daily.    Marland Kitchen. atorvastatin (LIPITOR) 10 MG tablet Take 1 tablet (10 mg total) by mouth every other day. 90 tablet 1   No current facility-administered medications on file prior to visit.    Allergies:  Allergies  Allergen  Reactions  . Celexa [Citalopram Hydrobromide]     No relief  . Pravastatin     Myalgias  . Wellbutrin [Bupropion]     No relief  . Zoloft [Sertraline Hcl]     aggitation   Health Maintenance:  Immunization History  Administered Date(s) Administered  . PPD Test 11/27/2013, 02/26/2015  . Td 11/29/2008  . Tdap 11/27/2013   Tetanus: 2014 Flu vaccine: Refuses  Colonoscopy: n/a EGD: n/a  Eye Exam: 2015, no issues Dentist: Never -  Encouraged to schedule at least annually  Patient Care Team: Lucky CowboyMcKeown, William, MD as PCP - General (Internal Medicine) Jodi GeraldsGraves, John, MD as Consulting Physician (Orthopedic Surgery) Gean Birchwoodowan, Frank, MD as Consulting Physician (Orthopedic Surgery)  Medical History:  has Hyperlipidemia; Vitamin D deficiency; Traumatic leg injury; Generalized anxiety disorder; Prediabetes; Overweight (BMI 25.0-29.9); Alcohol consumption binge drinking; and Smoker on their problem list.  Surgical History:  He  has a past surgical history that includes Debridement leg (Right); Ankle fracture surgery (Right, 1996); and Vasectomy (2014). Family History:  His family history includes Alcohol abuse in his paternal grandfather; Aneurysm in his maternal grandfather; Cancer in his mother; Depression in his father; Diabetes in his maternal aunt and sister; Heart disease in his maternal grandfather; Lung cancer in his paternal uncle; Stroke in his father. Social History:   reports that he has been smoking cigarettes. He started smoking about 32 years ago. He has been smoking about 0.50 packs per day. He has never used smokeless tobacco. He reports previous alcohol use. He reports that he does not use drugs. Review of Systems:  Review of Systems  Constitutional: Negative for malaise/fatigue and weight loss.  HENT: Negative for hearing loss and tinnitus.   Eyes: Negative for blurred vision and double vision.  Respiratory: Negative for cough, shortness of breath and wheezing.    Cardiovascular: Negative for chest pain, palpitations, orthopnea, claudication and leg swelling.  Gastrointestinal: Negative for abdominal pain, blood in stool, constipation, diarrhea, heartburn, melena, nausea and vomiting.  Genitourinary: Negative.   Musculoskeletal: Negative for joint pain and myalgias.  Skin: Negative for rash.  Neurological: Negative for dizziness, tingling, sensory change, weakness and headaches.  Endo/Heme/Allergies: Negative for polydipsia.  Psychiatric/Behavioral: Negative for depression, hallucinations, memory loss, substance abuse and suicidal ideas. The patient is not nervous/anxious and does not have insomnia.   All other systems reviewed and are negative.   Physical Exam: Estimated body mass index is 30.71 kg/m as calculated from the following:   Height as of this encounter: 5\' 8"  (1.727 m).   Weight as of this encounter: 202 lb (91.6 kg). BP 110/70   Pulse 64   Temp (!) 97.3 F (36.3 C)   Ht 5\' 8"  (1.727 m)   Wt 202 lb (91.6 kg)   SpO2 97%   BMI 30.71 kg/m  General Appearance: Well nourished, in no apparent distress.  Eyes: PERRLA, EOMs, conjunctiva no swelling or erythema, normal fundi and vessels.  Sinuses: No Frontal/maxillary tenderness  ENT/Mouth: Ext aud canals clear, normal light reflex with TMs without erythema, bulging. Poor dentition with halitosis; tongue appears large, smooth, somewhat red. No erythema, swelling, or exudate on post pharynx. Tonsils not swollen or erythematous. Hearing normal.  Neck: Supple, thyroid normal. No bruits  Respiratory: Respiratory effort normal, BS equal bilaterally without rales, rhonchi, wheezing or stridor.  Cardio: RRR without murmurs, rubs or gallops. Brisk peripheral pulses without edema.  Chest: symmetric, with normal excursions and percussion.  Abdomen: Soft, nontender, no guarding, rebound, hernias, masses, or organomegaly.  Lymphatics: Non tender without lymphadenopathy.  Genitourinary: No concerns;  defer Musculoskeletal: Full ROM all peripheral extremities,5/5 strength, and normal gait.  Skin: Warm, dry without rashes, lesions, ecchymosis. Neuro: Cranial nerves intact, reflexes equal bilaterally. Normal muscle tone, no cerebellar symptoms. Sensation intact.  Psych: Awake and oriented X 3, normal affect, Insight and Judgment appropriate.   EKG: WNL - sinus brady - no changes.  Carlyon ShadowAshley C Mcarthur Ivins 10:22 AM [facility]

## 2019-01-11 ENCOUNTER — Encounter: Payer: Self-pay | Admitting: Adult Health

## 2019-01-11 ENCOUNTER — Ambulatory Visit (INDEPENDENT_AMBULATORY_CARE_PROVIDER_SITE_OTHER): Payer: 59 | Admitting: Adult Health

## 2019-01-11 VITALS — BP 110/70 | HR 64 | Temp 97.3°F | Ht 68.0 in | Wt 202.0 lb

## 2019-01-11 DIAGNOSIS — F17200 Nicotine dependence, unspecified, uncomplicated: Secondary | ICD-10-CM

## 2019-01-11 DIAGNOSIS — I10 Essential (primary) hypertension: Secondary | ICD-10-CM | POA: Diagnosis not present

## 2019-01-11 DIAGNOSIS — Z0001 Encounter for general adult medical examination with abnormal findings: Secondary | ICD-10-CM

## 2019-01-11 DIAGNOSIS — Z136 Encounter for screening for cardiovascular disorders: Secondary | ICD-10-CM | POA: Diagnosis not present

## 2019-01-11 DIAGNOSIS — Z0000 Encounter for general adult medical examination without abnormal findings: Secondary | ICD-10-CM

## 2019-01-11 DIAGNOSIS — Z79899 Other long term (current) drug therapy: Secondary | ICD-10-CM

## 2019-01-11 DIAGNOSIS — E782 Mixed hyperlipidemia: Secondary | ICD-10-CM

## 2019-01-11 DIAGNOSIS — S8991XS Unspecified injury of right lower leg, sequela: Secondary | ICD-10-CM

## 2019-01-11 DIAGNOSIS — F1010 Alcohol abuse, uncomplicated: Secondary | ICD-10-CM

## 2019-01-11 DIAGNOSIS — R7303 Prediabetes: Secondary | ICD-10-CM

## 2019-01-11 DIAGNOSIS — E663 Overweight: Secondary | ICD-10-CM

## 2019-01-11 DIAGNOSIS — F411 Generalized anxiety disorder: Secondary | ICD-10-CM

## 2019-01-11 DIAGNOSIS — E559 Vitamin D deficiency, unspecified: Secondary | ICD-10-CM

## 2019-01-11 NOTE — Patient Instructions (Addendum)
[name] , Thank you for taking time to come for your Annual Wellness Visit. I appreciate your ongoing commitment to your health goals. Please review the following plan we discussed and let me know if I can assist you in the future.   These are the goals we discussed: Goals    . LDL CALC < 100    . Reduce/quit smoking       This is a list of the screening recommended for you and due dates:  Health Maintenance  Topic Date Due  . Flu Shot  07/17/2019*  . Tetanus Vaccine  11/28/2023  . HIV Screening  Completed  *Topic was postponed. The date shown is not the original due date.    Get started on vitamin D3 5000 IU daily      Know what a healthy weight is for you (roughly BMI <25) and aim to maintain this  Aim for 7+ servings of fruits and vegetables daily  65-80+ fluid ounces of water or unsweet tea for healthy kidneys  Avoid smoking/tobacco/alcohol  Limit animal fats in diet for cholesterol and heart health - choose grass fed whenever available  Avoid highly processed foods, and foods high in saturated/trans fats  Aim for low stress - take time to unwind and care for your mental health  Aim for 150 min of moderate intensity exercise weekly for heart health, and weights twice weekly for bone health  Aim for 7-9 hours of sleep daily     Coping with Quitting Smoking  Quitting smoking is a physical and mental challenge. You will face cravings, withdrawal symptoms, and temptation. Before quitting, work with your health care provider to make a plan that can help you cope. Preparation can help you quit and keep you from giving in. How can I cope with cravings? Cravings usually last for 5-10 minutes. If you get through it, the craving will pass. Consider taking the following actions to help you cope with cravings:  Keep your mouth busy: ? Chew sugar-free gum. ? Suck on hard candies or a straw. ? Brush your teeth.  Keep your hands and body busy: ? Immediately change to  a different activity when you feel a craving. ? Squeeze or play with a ball. ? Do an activity or a hobby, like making bead jewelry, practicing needlepoint, or working with wood. ? Mix up your normal routine. ? Take a short exercise break. Go for a quick walk or run up and down stairs. ? Spend time in public places where smoking is not allowed.  Focus on doing something kind or helpful for someone else.  Call a friend or family member to talk during a craving.  Join a support group.  Call a quit line, such as 1-800-QUIT-NOW.  Talk with your health care provider about medicines that might help you cope with cravings and make quitting easier for you. How can I deal with withdrawal symptoms? Your body may experience negative effects as it tries to get used to not having nicotine in the system. These effects are called withdrawal symptoms. They may include:  Feeling hungrier than normal.  Trouble concentrating.  Irritability.  Trouble sleeping.  Feeling depressed.  Restlessness and agitation.  Craving a cigarette. To manage withdrawal symptoms:  Avoid places, people, and activities that trigger your cravings.  Remember why you want to quit.  Get plenty of sleep.  Avoid coffee and other caffeinated drinks. These may worsen some of your symptoms. How can I handle social situations? Social  situations can be difficult when you are quitting smoking, especially in the first few weeks. To manage this, you can:  Avoid parties, bars, and other social situations where people might be smoking.  Avoid alcohol.  Leave right away if you have the urge to smoke.  Explain to your family and friends that you are quitting smoking. Ask for understanding and support.  Plan activities with friends or family where smoking is not an option. What are some ways I can cope with stress? Wanting to smoke may cause stress, and stress can make you want to smoke. Find ways to manage your stress.  Relaxation techniques can help. For example:  Breathe slowly and deeply, in through your nose and out through your mouth.  Listen to soothing, relaxing music.  Talk with a family member or friend about your stress.  Light a candle.  Soak in a bath or take a shower.  Think about a peaceful place. What are some ways I can prevent weight gain? Be aware that many people gain weight after they quit smoking. However, not everyone does. To keep from gaining weight, have a plan in place before you quit and stick to the plan after you quit. Your plan should include:  Having healthy snacks. When you have a craving, it may help to: ? Eat plain popcorn, crunchy carrots, celery, or other cut vegetables. ? Chew sugar-free gum.  Changing how you eat: ? Eat small portion sizes at meals. ? Eat 4-6 small meals throughout the day instead of 1-2 large meals a day. ? Be mindful when you eat. Do not watch television or do other things that might distract you as you eat.  Exercising regularly: ? Make time to exercise each day. If you do not have time for a long workout, do short bouts of exercise for 5-10 minutes several times a day. ? Do some form of strengthening exercise, like weight lifting, and some form of aerobic exercise, like running or swimming.  Drinking plenty of water or other low-calorie or no-calorie drinks. Drink 6-8 glasses of water daily, or as much as instructed by your health care provider. Summary  Quitting smoking is a physical and mental challenge. You will face cravings, withdrawal symptoms, and temptation to smoke again. Preparation can help you as you go through these challenges.  You can cope with cravings by keeping your mouth busy (such as by chewing gum), keeping your body and hands busy, and making calls to family, friends, or a helpline for people who want to quit smoking.  You can cope with withdrawal symptoms by avoiding places where people smoke, avoiding drinks with  caffeine, and getting plenty of rest.  Ask your health care provider about the different ways to prevent weight gain, avoid stress, and handle social situations. This information is not intended to replace advice given to you by your health care provider. Make sure you discuss any questions you have with your health care provider. Document Released: 11/12/2016 Document Revised: 11/12/2016 Document Reviewed: 11/12/2016 Elsevier Interactive Patient Education  2019 [employer].

## 2019-01-12 ENCOUNTER — Encounter: Payer: Self-pay | Admitting: Adult Health

## 2019-01-12 ENCOUNTER — Other Ambulatory Visit: Payer: Self-pay | Admitting: Adult Health

## 2019-01-12 LAB — CBC WITH DIFFERENTIAL/PLATELET
ABSOLUTE MONOCYTES: 1096 {cells}/uL — AB (ref 200–950)
BASOS ABS: 25 {cells}/uL (ref 0–200)
Basophils Relative: 0.2 %
EOS ABS: 151 {cells}/uL (ref 15–500)
Eosinophils Relative: 1.2 %
HEMATOCRIT: 44.6 % (ref 38.5–50.0)
HEMOGLOBIN: 15.1 g/dL (ref 13.2–17.1)
LYMPHS ABS: 3326 {cells}/uL (ref 850–3900)
MCH: 31.3 pg (ref 27.0–33.0)
MCHC: 33.9 g/dL (ref 32.0–36.0)
MCV: 92.3 fL (ref 80.0–100.0)
MONOS PCT: 8.7 %
MPV: 10.9 fL (ref 7.5–12.5)
Neutro Abs: 8001 cells/uL — ABNORMAL HIGH (ref 1500–7800)
Neutrophils Relative %: 63.5 %
Platelets: 229 10*3/uL (ref 140–400)
RBC: 4.83 10*6/uL (ref 4.20–5.80)
RDW: 12.2 % (ref 11.0–15.0)
Total Lymphocyte: 26.4 %
WBC: 12.6 10*3/uL — ABNORMAL HIGH (ref 3.8–10.8)

## 2019-01-12 LAB — URINALYSIS, ROUTINE W REFLEX MICROSCOPIC
Bacteria, UA: NONE SEEN /HPF
Bilirubin Urine: NEGATIVE
Glucose, UA: NEGATIVE
Hgb urine dipstick: NEGATIVE
Hyaline Cast: NONE SEEN /LPF
Nitrite: NEGATIVE
PROTEIN: NEGATIVE
RBC / HPF: NONE SEEN /HPF (ref 0–2)
Specific Gravity, Urine: 1.024 (ref 1.001–1.03)
Squamous Epithelial / HPF: NONE SEEN /HPF (ref <=5)
WBC, UA: NONE SEEN /HPF (ref 0–5)
pH: 5.5 (ref 5.0–8.0)

## 2019-01-12 LAB — COMPLETE METABOLIC PANEL WITH GFR
AG Ratio: 1.7 (calc) (ref 1.0–2.5)
ALKALINE PHOSPHATASE (APISO): 71 U/L (ref 36–130)
ALT: 34 U/L (ref 9–46)
AST: 24 U/L (ref 10–40)
Albumin: 4.3 g/dL (ref 3.6–5.1)
BILIRUBIN TOTAL: 0.4 mg/dL (ref 0.2–1.2)
BUN: 14 mg/dL (ref 7–25)
CHLORIDE: 104 mmol/L (ref 98–110)
CO2: 28 mmol/L (ref 20–32)
CREATININE: 0.96 mg/dL (ref 0.60–1.35)
Calcium: 9.6 mg/dL (ref 8.6–10.3)
GFR, Est African American: 112 mL/min/{1.73_m2} (ref >=60)
GFR, Est Non African American: 96 mL/min/{1.73_m2} (ref >=60)
GLOBULIN: 2.5 g/dL (ref 1.9–3.7)
GLUCOSE: 81 mg/dL (ref 65–99)
Potassium: 4.5 mmol/L (ref 3.5–5.3)
SODIUM: 140 mmol/L (ref 135–146)
Total Protein: 6.8 g/dL (ref 6.1–8.1)

## 2019-01-12 LAB — HEMOGLOBIN A1C
Hgb A1c MFr Bld: 5.6 % of total Hgb (ref <5.7)
MEAN PLASMA GLUCOSE: 114 (calc)
eAG (mmol/L): 6.3 (calc)

## 2019-01-12 LAB — LIPID PANEL
Cholesterol: 253 mg/dL — ABNORMAL HIGH (ref <200)
HDL: 37 mg/dL — ABNORMAL LOW (ref >=40)
LDL Cholesterol (Calc): 171 mg/dL (calc) — ABNORMAL HIGH
Non-HDL Cholesterol (Calc): 216 mg/dL (calc) — ABNORMAL HIGH (ref <130)
Total CHOL/HDL Ratio: 6.8 (calc) — ABNORMAL HIGH (ref <5.0)
Triglycerides: 277 mg/dL — ABNORMAL HIGH (ref <150)

## 2019-01-12 LAB — VITAMIN D 25 HYDROXY (VIT D DEFICIENCY, FRACTURES): Vit D, 25-Hydroxy: 19 ng/mL — ABNORMAL LOW (ref 30–100)

## 2019-01-12 LAB — MICROALBUMIN / CREATININE URINE RATIO
Creatinine, Urine: 170 mg/dL (ref 20–320)
Microalb Creat Ratio: 4 mcg/mg creat (ref <30)
Microalb, Ur: 0.6 mg/dL

## 2019-01-12 LAB — TSH: TSH: 3.11 mIU/L (ref 0.40–4.50)

## 2019-01-12 LAB — MAGNESIUM: MAGNESIUM: 2 mg/dL (ref 1.5–2.5)

## 2019-01-12 MED ORDER — ROSUVASTATIN CALCIUM 5 MG PO TABS: ORAL_TABLET | ORAL | 1 refills | Status: DC

## 2019-01-30 ENCOUNTER — Other Ambulatory Visit: Payer: Self-pay

## 2019-01-30 MED ORDER — NALTREXONE HCL 50 MG PO TABS: 50.0000 mg | ORAL_TABLET | Freq: Every day | ORAL | 1 refills | Status: DC

## 2019-01-30 NOTE — Telephone Encounter (Signed)
Refill request for Naltrexone.

## 2019-04-01 ENCOUNTER — Other Ambulatory Visit: Payer: Self-pay | Admitting: Adult Health

## 2019-04-13 ENCOUNTER — Ambulatory Visit: Payer: Self-pay | Admitting: Adult Health

## 2019-04-16 NOTE — Progress Notes (Deleted)
FOLLOW UP  Assessment and Plan:   Cholesterol Currently above goal; newly on rosuvastatin 5 mg daily *** Continue low cholesterol diet and exercise.  Check lipid panel.   Prediabetes Continue diet and exercise.  Perform daily foot/skin check, notify office of any concerning changes.  Check A1C  Overweight Long discussion about weight loss, diet, and exercise Recommended diet heavy in fruits and veggies and low in animal meats, cheeses, and dairy products, appropriate calorie intake Discussed ideal weight for height  Patient will work on cutting down on meat intake, portions, highly processed foods Will follow up in 3 months  Vitamin D Def Below goal at last visit; he has not initiated supplementation *** continue supplementation to maintain goal of 60-100 *** Vit D level  Anxiety Off medications and reportedly doing well  Stress management techniques discussed, increase water, good sleep hygiene discussed, increase exercise, and increase veggies.   Tobacco use Discussed risks associated with tobacco use and advised to reduce or quit Patient is not ready to do so, but advised to consider strongly Will follow up at the next visit  Alcohol abuse in recent admission [facility] and doing well; continues with naltrexone and daily AA ***   Continue diet and meds as discussed. Further disposition pending results of labs. Discussed med's effects and SE's.   Over 30 minutes of exam, counseling, chart review, and critical decision making was performed.   Future Appointments  Date Time Provider Department Center  04/18/2019  3:30 PM Judd Gaudierorbett, Zuria Fosdick, NP [facility] None  07/13/2019 10:15 AM Judd Gaudierorbett, Aubriauna Riner, NP [facility] None  01/14/2020 10:00 AM Judd Gaudierorbett, Haneen Bernales, NP [facility] None    ----------------------------------------------------------------------------------------------------------------------  HPI 43 y.o. male, married with 4 children, presents for  6 month follow up on cholesterol, prediabetes, weight, smoking/bringe drinking, anxiety and vitamin D deficiency.    He has hx of alcohol binge- drinking and in 11/2018 was admitted [facility] [place]. He reports he is doing very well after this, strongly recommends program, doing AA nearly daily, has been sober since 12/04/2018. He is on naltrexone 50 mg and feels he is doing well with this. Had anxiety, didn't tolerate medications well but denies problems since quitting alcohol.   he currently continues to smoke 0.5 pack a day; discussed risks associated with smoking, patient is not ready to quit at this time, but is interested in doing so in the future with awareness of risks.   BMI is There is no height or weight on file to calculate BMI., he he has been working on diet and exercise, he is very active with job, plays sports, lifts weights daily. He does make sure to eat fruits and salads, avoids fried foods, limited bread. He is cutting back on sugar intake, switching to black coffee, does drink a pot a day. He drinks 20 ounces of water x 3-4 times daily. He gained up to 220 lb in rehab, is in the process of losing weight.  Wt Readings from Last 3 Encounters:  01/11/19 202 lb (91.6 kg)  12/07/18 191 lb (86.6 kg)  09/15/18 196 lb 6.4 oz (89.1 kg)   Today their BP is    He does not workout. He denies chest pain, shortness of breath, dizziness.   He is on cholesterol medication - rosuvastatin 5 mg daily (new). He denies having myalgias on medication. His cholesterol is not at goal. The cholesterol last visit was:   Lab Results  Component Value  Date   CHOL 253 (H) 01/11/2019   HDL 37 (L) 01/11/2019   LDLCALC 171 (H) 01/11/2019   TRIG 277 (H) 01/11/2019   CHOLHDL 6.8 (H) 01/11/2019    He has not been working on diet and exercise for glucose management, and denies hyperglycemia, increased appetite, nausea, paresthesia of the feet, polydipsia  and polyuria. Last A1C in the office was:  Lab Results  Component Value Date   HGBA1C 5.6 01/11/2019   Patient is *** on Vitamin D supplement.   Lab Results  Component Value Date   VD25OH 19 (L) 01/11/2019       Current Medications:  Current Outpatient Medications on File Prior to Visit  Medication Sig  . naltrexone (DEPADE) 50 MG tablet Take 1 tablet (50 mg total) by mouth daily.  . rosuvastatin (CRESTOR) 5 MG tablet Take 1 tablet Daily or as Directed for Cholesterol   No current facility-administered medications on file prior to visit.      Allergies:  Allergies  Allergen Reactions  . Celexa [Citalopram Hydrobromide]     No relief  . Pravastatin     Myalgias  . Wellbutrin [Bupropion]     No relief  . Zoloft [Sertraline Hcl]     aggitation     Medical History:  Past Medical History:  Diagnosis Date  . 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 Generalized anxiety disorder 02/27/2015  . Hyperlipidemia   . Prediabetes 12/29/2017  . Traumatic leg injury 11/28/2013  . Vitamin D deficiency    Family history- Reviewed and unchanged Social history- Reviewed and unchanged   Review of Systems:  Review of Systems  Constitutional: Negative for malaise/fatigue and weight loss.  HENT: Negative for hearing loss and tinnitus.   Eyes: Negative for blurred vision and double vision.  Respiratory: Negative for cough, shortness of breath and wheezing.   Cardiovascular: Negative for chest pain, palpitations, orthopnea, claudication and leg swelling.  Gastrointestinal: Negative for abdominal pain, blood in stool, constipation, diarrhea, heartburn, melena, nausea and vomiting.  Genitourinary: Negative.   Musculoskeletal: Negative for joint pain and myalgias.  Skin: Negative for rash.  Neurological: Negative for dizziness, tingling, sensory change, weakness and headaches.  Endo/Heme/Allergies: Negative for polydipsia.  Psychiatric/Behavioral: Negative for depression,  hallucinations, substance abuse and suicidal ideas. The patient is not nervous/anxious and does not have insomnia.   All other systems reviewed and are negative.    Physical Exam: There were no vitals taken for this visit. Wt Readings from Last 3 Encounters:  01/11/19 202 lb (91.6 kg)  12/07/18 191 lb (86.6 kg)  09/15/18 196 lb 6.4 oz (89.1 kg)   General Appearance: Well nourished, in no apparent distress. Eyes: PERRLA, EOMs, conjunctiva no swelling or erythema Sinuses: No Frontal/maxillary tenderness ENT/Mouth: Ext aud canals clear, TMs without erythema, bulging. No erythema, swelling, or exudate on post pharynx.  Tonsils not swollen or erythematous. Hearing normal.  Neck: Supple, thyroid normal.  Respiratory: Respiratory effort normal, BS equal bilaterally without rales, rhonchi, wheezing or stridor.  Cardio: RRR with no MRGs. Brisk peripheral pulses without edema.  Abdomen: Soft, + BS.  Non tender, no guarding, rebound, hernias, masses. Lymphatics: Non tender without lymphadenopathy.  Musculoskeletal: Full ROM, 5/5 strength, Normal gait Skin: Warm, dry without rashes, lesions, ecchymosis.  Neuro: Cranial nerves intact. No cerebellar symptoms.  Psych: Awake and oriented X 3, normal affect, Insight and Judgment appropriate.    [name], NP 9:28 AM [facility]

## 2019-04-18 ENCOUNTER — Ambulatory Visit: Payer: 59 | Admitting: Adult Health

## 2019-07-13 ENCOUNTER — Encounter

## 2019-07-13 ENCOUNTER — Ambulatory Visit: Payer: Self-pay | Admitting: Adult Health

## 2019-07-30 NOTE — Progress Notes (Signed)
FOLLOW UP  Assessment and Plan:   Cholesterol Currently above goal; very poorly compliant with medications - never started rosuvastatin 5 mg daily  Extended discussion of risks of MI, CVA, ED, dementia with untreated severe hyperlipidemia, with smoking history and family cardiac history  He is agreeable to starting Continue low cholesterol diet and exercise.  Check lipid panel.   Other abnormal glucose Continue diet and exercise.  Perform daily foot/skin check, notify office of any concerning changes.  Check A1C at CPE; last was normal; monitor serum glucose, weight   Overweight Long discussion about weight loss, diet, and exercise Recommended diet heavy in fruits and veggies and low in animal meats, cheeses, and dairy products, appropriate calorie intake Discussed ideal weight for height  Patient will work on cutting down on meat intake, portions, highly processed foods Will follow up in 3 months  Vitamin D Def Below goal at last visit; he has not initiated supplementation Encouraged supplementation for goal of 60-100 Defer Vit D level to CPE  Anxiety Off medications and reportedly doing well  Stress management techniques discussed, increase water, good sleep hygiene discussed, increase exercise, and increase veggies.   Tobacco use Discussed risks associated with tobacco use and advised to reduce or quit Patient is not ready to do so, but advised to consider strongly; resources provided on AVS, declines chantix or other medication Will follow up at the next visit  Binge drinking Patient reports not longer binge drinking; off of naltrexone and reports continues to do well    Continue diet and meds as discussed. Further disposition pending results of labs. Discussed med's effects and SE's.   Over 30 minutes of exam, counseling, chart review, and critical decision making was performed.   Future Appointments  Date Time Provider [place]  01/14/2020 10:00 AM  Liane Comber, Tracy Barrett [facility] None    ----------------------------------------------------------------------------------------------------------------------  HPI 43 y.o. male, married with 4 children, presents for 6 month follow up on cholesterol, glucose management, weight, smoking/bringe drinking, anxiety and vitamin D deficiency.    He has hx of alcohol binge- drinking and earlier this year was admitted to inpatient 4 week intensive treatment at [facility] in [place]. 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 He was on naltrexone 50 mg but has since tapered off and reports he continues to do well. "Not a problem any more." He does still communicate with people he met through [employer] and [employer].   Had anxiety, didn't tolerate medications well but denies problems since quitting alcohol.   he currently continues to smoke 0.5 pack a day; discussed risks associated with smoking, patient is not ready to quit at this time, but is interested in doing so in the future with awareness of risks.   BMI is Body mass index is 30.56 kg/m., he has not been working on diet and exercise; he reports he is active during the day and rarely sits, works a physically intense job.  Wt Readings from Last 3 Encounters:  08/01/19 201 lb (91.2 kg)  01/11/19 202 lb (91.6 kg)  12/07/18 191 lb (86.6 kg)   Today their BP is BP: 110/64  He does not workout. He denies chest pain, shortness of breath, dizziness.   He is not on cholesterol medication - prescribed rosuvastatin 5. He denies having myalgias on medication. His cholesterol is not at goal. The cholesterol last visit was:  Lab Results  Component Value Date   CHOL 253 (H) 01/11/2019   HDL 37 (L) 01/11/2019   LDLCALC 171 (H) 01/11/2019   TRIG 277 (H) 01/11/2019   CHOLHDL 6.8 (H) 01/11/2019    He has not been working on diet and  exercise for glucose management, and denies hyperglycemia, increased appetite, nausea, paresthesia of the feet, polydipsia and polyuria. Last A1C in the office was:  Lab Results  Component Value Date   HGBA1C 5.6 01/11/2019   Patient is not on Vitamin D supplement.   Lab Results  Component Value Date   VD25OH 19 (L) 01/11/2019       Current Medications:  Current Outpatient Medications on File Prior to Visit  Medication Sig  . naltrexone (DEPADE) 50 MG tablet Take 1 tablet (50 mg total) by mouth daily.  . rosuvastatin (CRESTOR) 5 MG tablet Take 1 tablet Daily or as Directed for Cholesterol   No current facility-administered medications on file prior to visit.      Allergies:  Allergies  Allergen Reactions  . Celexa [Citalopram Hydrobromide]     No relief  . Pravastatin     Myalgias  . Wellbutrin [Bupropion]     No relief  . Zoloft [Sertraline Hcl]     aggitation     Medical History:  Past Medical History:  Diagnosis Date  . 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 Generalized anxiety disorder 02/27/2015  . Hyperlipidemia   . Prediabetes 12/29/2017  . Traumatic leg injury 11/28/2013  . Vitamin D deficiency    Family history- Reviewed and unchanged Social history- Reviewed and unchanged   Review of Systems:  Review of Systems  Constitutional: Negative for malaise/fatigue and weight loss.  HENT: Negative for hearing loss and tinnitus.   Eyes: Negative for blurred vision and double vision.  Respiratory: Negative for cough, shortness of breath and wheezing.   Cardiovascular: Negative for chest pain, palpitations, orthopnea, claudication and leg swelling.  Gastrointestinal: Negative for abdominal pain, blood in stool, constipation, diarrhea, heartburn, melena, nausea and vomiting.  Genitourinary: Negative.   Musculoskeletal: Negative for joint pain and myalgias.  Skin: Negative for rash.  Neurological: Negative for dizziness, tingling, sensory change,  weakness and headaches.  Endo/Heme/Allergies: Negative for polydipsia.  Psychiatric/Behavioral: Negative for depression, hallucinations, substance abuse and suicidal ideas. The patient is not nervous/anxious and does not have insomnia.   All other systems reviewed and are negative.    Physical Exam: BP 110/64   Pulse 76   Temp 97.7 F (36.5 C)   Ht _0  (1.727 m)   Wt 201 lb (91.2 kg)   SpO2 98%   BMI 30.56 kg/m  Wt Readings from Last 3 Encounters:  08/01/19 201 lb (91.2 kg)  01/11/19 202 lb (91.6 kg)  12/07/18 191 lb (86.6 kg)   General Appearance: Well nourished, in no apparent distress. Eyes: PERRLA, EOMs, conjunctiva no swelling or erythema Sinuses: No Frontal/maxillary tenderness ENT/Mouth: Ext aud canals clear, TMs without erythema, bulging. No erythema, swelling, or exudate on post pharynx.  Tonsils not swollen or erythematous. Hearing normal.  Neck: Supple, thyroid normal.  Respiratory: Respiratory effort normal, BS equal bilaterally without rales, rhonchi, wheezing or stridor.  Cardio: RRR with no MRGs. Brisk peripheral pulses without edema.  Abdomen: Soft, + BS.  Non tender, no guarding, rebound, hernias, masses. Lymphatics: Non tender without lymphadenopathy.  Musculoskeletal: Full ROM, 5/5 strength, Normal gait Skin: Warm, dry without rashes, lesions, ecchymosis.  Neuro: Cranial nerves intact. No cerebellar symptoms.  Psych: Awake and oriented X 3, normal affect, Insight and Judgment appropriate.    Tracy Ribas, Tracy Barrett 3:54 PM [facility]

## 2019-08-01 ENCOUNTER — Other Ambulatory Visit: Payer: Self-pay

## 2019-08-01 ENCOUNTER — Encounter: Payer: Self-pay | Admitting: Adult Health

## 2019-08-01 ENCOUNTER — Ambulatory Visit: Payer: 59 | Admitting: Adult Health

## 2019-08-01 VITALS — BP 110/64 | HR 76 | Temp 97.7°F | Ht 68.0 in | Wt 201.0 lb

## 2019-08-01 DIAGNOSIS — Z79899 Other long term (current) drug therapy: Secondary | ICD-10-CM

## 2019-08-01 DIAGNOSIS — F1021 Alcohol dependence, in remission: Secondary | ICD-10-CM | POA: Diagnosis not present

## 2019-08-01 DIAGNOSIS — E559 Vitamin D deficiency, unspecified: Secondary | ICD-10-CM

## 2019-08-01 DIAGNOSIS — F411 Generalized anxiety disorder: Secondary | ICD-10-CM

## 2019-08-01 DIAGNOSIS — R7309 Other abnormal glucose: Secondary | ICD-10-CM

## 2019-08-01 DIAGNOSIS — E782 Mixed hyperlipidemia: Secondary | ICD-10-CM | POA: Diagnosis not present

## 2019-08-01 DIAGNOSIS — F17200 Nicotine dependence, unspecified, uncomplicated: Secondary | ICD-10-CM

## 2019-08-01 DIAGNOSIS — E663 Overweight: Secondary | ICD-10-CM

## 2019-08-01 NOTE — Patient Instructions (Addendum)
Please start taking rosuvastatin (crestor) - 5 mg - start by taking 3 nights a week (MWF) - if no side effects (muscle aches or weakness - then go up to taking daily in 2 weeks  Read labels for saturated fat - lower is better  Bake or steam as much possible   Limit fatty cuts of meat and high fat daily  Limit fast food and processed foods - tend to be high in saturated fats which intend shelf life  Stick to olive oil for cooking  Recommend getting on a vitamin D - could try the drops, up to 5000 IU worth daily       SMOKING CESSATION  American cancer society  [phone] for more information or for a free program for smoking cessation help.   You can call QUIT SMART 1-800-QUIT-NOW for free nicotine patches or replacement therapy- if they are out- keep calling  [facility] Can call for smoking cessation classes, [phone]  If you have a smart phone, please look up Smoke Free app, this will help you stay on track and give you information about money you have saved, life that you have gained back and a ton of more information.     ADVANTAGES OF QUITTING SMOKING  Within 20 minutes, blood pressure decreases. Your pulse is at normal level.  After 8 hours, carbon monoxide levels in the blood return to normal. Your oxygen level increases.  After 24 hours, the chance of having a heart attack starts to decrease. Your breath, hair, and body stop smelling like smoke.  After 48 hours, damaged nerve endings begin to recover. Your sense of taste and smell improve.  After 72 hours, the body is virtually free of nicotine. Your bronchial tubes relax and breathing becomes easier.  After 2 to 12 weeks, lungs can hold more air. Exercise becomes easier and circulation improves.  After 1 year, the risk of coronary heart disease is cut in half.  After 5 years, the risk of stroke falls to the same as a nonsmoker.  After 10 years, the risk of lung cancer is cut in half and  the risk of other cancers decreases significantly.  After 15 years, the risk of coronary heart disease drops, usually to the level of a nonsmoker.  You will have extra money to spend on things other than cigarettes.       Coping with Quitting Smoking  Quitting smoking is a physical and mental challenge. You will face cravings, withdrawal symptoms, and temptation. Before quitting, work with your health care provider to make a plan that can help you cope. Preparation can help you quit and keep you from giving in. How can I cope with cravings? Cravings usually last for 5-10 minutes. If you get through it, the craving will pass. Consider taking the following actions to help you cope with cravings:  Keep your mouth busy: ? Chew sugar-free gum. ? Suck on hard candies or a straw. ? Brush your teeth.  Keep your hands and body busy: ? Immediately change to a different activity when you feel a craving. ? Squeeze or play with a ball. ? Do an activity or a hobby, like making bead jewelry, practicing needlepoint, or working with wood. ? Mix up your normal routine. ? Take a short exercise break. Go for a quick walk or run up and down stairs. ? Spend time in public places where smoking is not allowed.  Focus on doing something kind or helpful for someone else.  Call a friend or family member to talk during a craving.  Join a support group.  Call a quit line, such as 1-800-QUIT-NOW.  Talk with your health care provider about medicines that might help you cope with cravings and make quitting easier for you. How can I deal with withdrawal symptoms? Your body may experience negative effects as it tries to get used to not having nicotine in the system. These effects are called withdrawal symptoms. They may include:  Feeling hungrier than normal.  Trouble concentrating.  Irritability.  Trouble sleeping.  Feeling depressed.  Restlessness and agitation.  Craving a cigarette. To manage  withdrawal symptoms:  Avoid places, people, and activities that trigger your cravings.  Remember why you want to quit.  Get plenty of sleep.  Avoid coffee and other caffeinated drinks. These may worsen some of your symptoms. How can I handle social situations? Social situations can be difficult when you are quitting smoking, especially in the first few weeks. To manage this, you can:  Avoid parties, bars, and other social situations where people might be smoking.  Avoid alcohol.  Leave right away if you have the urge to smoke.  Explain to your family and friends that you are quitting smoking. Ask for understanding and support.  Plan activities with friends or family where smoking is not an option. What are some ways I can cope with stress? Wanting to smoke may cause stress, and stress can make you want to smoke. Find ways to manage your stress. Relaxation techniques can help. For example:  Breathe slowly and deeply, in through your nose and out through your mouth.  Listen to soothing, relaxing music.  Talk with a family member or friend about your stress.  Light a candle.  Soak in a bath or take a shower.  Think about a peaceful place. What are some ways I can prevent weight gain? Be aware that many people gain weight after they quit smoking. However, not everyone does. To keep from gaining weight, have a plan in place before you quit and stick to the plan after you quit. Your plan should include:  Having healthy snacks. When you have a craving, it may help to: ? Eat plain popcorn, crunchy carrots, celery, or other cut vegetables. ? Chew sugar-free gum.  Changing how you eat: ? Eat small portion sizes at meals. ? Eat 4-6 small meals throughout the day instead of 1-2 large meals a day. ? Be mindful when you eat. Do not watch television or do other things that might distract you as you eat.  Exercising regularly: ? Make time to exercise each day. If you do not have time  for a long workout, do short bouts of exercise for 5-10 minutes several times a day. ? Do some form of strengthening exercise, like weight lifting, and some form of aerobic exercise, like running or swimming.  Drinking plenty of water or other low-calorie or no-calorie drinks. Drink 6-8 glasses of water daily, or as much as instructed by your health care provider. Summary  Quitting smoking is a physical and mental challenge. You will face cravings, withdrawal symptoms, and temptation to smoke again. Preparation can help you as you go through these challenges.  You can cope with cravings by keeping your mouth busy (such as by chewing gum), keeping your body and hands busy, and making calls to family, friends, or a helpline for people who want to quit smoking.  You can cope with withdrawal symptoms by avoiding places where  people smoke, avoiding drinks with caffeine, and getting plenty of rest.  Ask your health care provider about the different ways to prevent weight gain, avoid stress, and handle social situations. This information is not intended to replace advice given to you by your health care provider. Make sure you discuss any questions you have with your health care provider. Document Released: 11/12/2016 Document Revised: 10/28/2017 Document Reviewed: 11/12/2016 Elsevier Patient Education  [place].     Rosuvastatin Tablets What is this medicine? ROSUVASTATIN (roe SOO va sta tin) is known as a HMG-CoA reductase inhibitor or 'statin'. It lowers cholesterol and triglycerides in the blood. This drug may also reduce the risk of heart attack, stroke, or other health problems in patients with risk factors for heart disease. Diet and lifestyle changes are often used with this drug. This medicine may be used for other purposes; ask your health care provider or pharmacist if you have questions. COMMON BRAND NAME(S): Crestor What should I tell my health care provider before I take  this medicine? They need to know if you have any of these conditions:  diabetes  if you often drink alcohol  history of stroke  kidney disease  liver disease  muscle aches or weakness  thyroid disease  an unusual or allergic reaction to rosuvastatin, other medicines, foods, dyes, or preservatives  pregnant or trying to get pregnant  breast-feeding How should I use this medicine? Take this medicine by mouth with a glass of water. Follow the directions on the prescription label. Do not cut, crush or chew this medicine. You can take this medicine with or without food. Take your doses at regular intervals. Do not take your medicine more often than directed. Talk to your pediatrician regarding the use of this medicine in children. While this drug may be prescribed for children as young as 89 years old for selected conditions, precautions do apply. Overdosage: If you think you have taken too much of this medicine contact a poison control center or emergency room at once. NOTE: This medicine is only for you. Do not share this medicine with others. What if I miss a dose? If you miss a dose, take it as soon as you can. If your next dose is to be taken in less than 12 hours, then do not take the missed dose. Take the next dose at your regular time. Do not take double or extra doses. What may interact with this medicine? Do not take this medicine with any of the following medications:  herbal medicines like red yeast rice This medicine may also interact with the following medications:  alcohol  antacids containing aluminum hydroxide or magnesium hydroxide  cyclosporine  other medicines for high cholesterol  some medicines for HIV infection  warfarin This list may not describe all possible interactions. Give your health care provider a list of all the medicines, herbs, non-prescription drugs, or dietary supplements you use. Also tell them if you smoke, drink alcohol, or use illegal  drugs. Some items may interact with your medicine. What should I watch for while using this medicine? Visit your doctor or health care professional for regular check-ups. You may need regular tests to make sure your liver is working properly. Your health care professional may tell you to stop taking this medicine if you develop muscle problems. If your muscle problems do not go away after stopping this medicine, contact your health care professional. Do not become pregnant while taking this medicine. Women should inform their health  care professional if they wish to become pregnant or think they might be pregnant. There is a potential for serious side effects to an unborn child. Talk to your health care professional or pharmacist for more information. Do not breast-feed an infant while taking this medicine. This medicine may increase blood sugar. Ask your healthcare provider if changes in diet or medicines are needed if you have diabetes. If you are going to need surgery or other procedure, tell your doctor that you are using this medicine. This drug is only part of a total heart-health program. Your doctor or a dietician can suggest a low-cholesterol and low-fat diet to help. Avoid alcohol and smoking, and keep a proper exercise schedule. This medicine may cause a decrease in Co-Enzyme Q-10. You should make sure that you get enough Co-Enzyme Q-10 while you are taking this medicine. Discuss the foods you eat and the vitamins you take with your health care professional. What side effects may I notice from receiving this medicine? Side effects that you should report to your doctor or health care professional as soon as possible:  allergic reactions like skin rash, itching or hives, swelling of the face, lips, or tongue  confusion  joint pain  loss of memory  redness, blistering, peeling or loosening of the skin, including inside the mouth  signs and symptoms of high blood sugar such as being more  thirsty or hungry or having to urinate more than normal. You may also feel very tired or have blurry vision.  signs and symptoms of muscle injury like dark urine; trouble passing urine or change in the amount of urine; unusually weak or tired; muscle pain or side or back pain  yellowing of the eyes or skin Side effects that usually do not require medical attention (report to your doctor or health care professional if they continue or are bothersome):  constipation  diarrhea  dizziness  gas  headache  nausea  stomach pain  trouble sleeping  upset stomach This list may not describe all possible side effects. Call your doctor for medical advice about side effects. You may report side effects to FDA at 1-800-FDA-1088. Where should I keep my medicine? Keep out of the reach of children. Store at room temperature between 20 and 25 degrees C (68 and 77 degrees F). Keep container tightly closed (protect from moisture). Throw away any unused medicine after the expiration date. NOTE: This sheet is a summary. It may not cover all possible information. If you have questions about this medicine, talk to your doctor, pharmacist, or health care provider.  2020 Elsevier/Gold Standard (2018-09-07 08:25:08)        Preventing High Cholesterol Cholesterol is a white, waxy substance similar to fat that the human body needs to help build cells. The liver makes all the cholesterol that a person's body needs. Having high cholesterol (hypercholesterolemia) increases a person's risk for heart disease and stroke. Extra (excess) cholesterol comes from the food the person eats. High cholesterol can often be prevented with diet and lifestyle changes. If you already have high cholesterol, you can control it with diet and lifestyle changes and with medicine. How can high cholesterol affect me? If you have high cholesterol, deposits (plaques) may build up on the walls of your arteries. The arteries are the  blood vessels that carry blood away from your heart. Plaques make the arteries narrower and stiffer. This can limit or block blood flow and cause blood clots to form. Blood clots:  Are tiny balls  of cells that form in your blood.  Can move to the heart or brain, causing a heart attack or stroke. Plaques in arteries greatly increase your risk for heart attack and stroke.Making diet and lifestyle changes can reduce your risk for these conditions that may threaten your life. What can increase my risk? This condition is more likely to develop in people who:  Eat foods that are high in saturated fat or cholesterol. Saturated fat is mostly found in: ? Foods that contain animal fat, such as red meat and some dairy products. ? Certain fatty foods made from plants, such as tropical oils.  Are overweight.  Are not getting enough exercise.  Have a family history of high cholesterol. What actions can I take to prevent this? Nutrition   Eat less saturated fat.  Avoid trans fats (partially hydrogenated oils). These are often found in margarine and in some baked goods, fried foods, and snacks bought in packages.  Avoid precooked or cured meat, such as sausages or meat loaves.  Avoid foods and drinks that have added sugars.  Eat more fruits, vegetables, and whole grains.  Choose healthy sources of protein, such as fish, poultry, lean cuts of red meat, beans, peas, lentils, and nuts.  Choose healthy sources of fat, such as: ? Nuts. ? Vegetable oils, especially olive oil. ? Fish that have healthy fats (omega-3 fatty acids), such as mackerel or salmon. The items listed above may not be a complete list of recommended foods and beverages. Contact a dietitian for more information. Lifestyle  Lose weight if you are overweight. Losing 5-10 lb (2.3-4.5 kg) can help prevent or control high cholesterol. It can also lower your risk for diabetes and high blood pressure. Ask your health care provider to  help you with a diet and exercise plan to lose weight safely.  Do not use any products that contain nicotine or tobacco, such as cigarettes, e-cigarettes, and chewing tobacco. If you need help quitting, ask your health care provider.  Limit your alcohol intake. ? Do not drink alcohol if:  Your health care provider tells you not to drink.  You are pregnant, may be pregnant, or are planning to become pregnant. ? If you drink alcohol:  Limit how much you use to:  0-1 drink a day for women.  0-2 drinks a day for men.  Be aware of how much alcohol is in your drink. In the [place] one drink equals one 12 oz bottle of beer (355 mL), one 5 oz glass of wine (148 mL), or one 1 oz glass of hard liquor (44 mL). Activity   Get enough exercise. Each week, do at least 150 minutes of exercise that takes a medium level of effort (moderate-intensity exercise). ? This is exercise that:  Makes your heart beat faster and makes you breathe harder than usual.  Allows you to still be able to talk. ? You could exercise in short sessions several times a day or longer sessions a few times a week. For example, on 5 days each week, you could walk fast or ride your bike 3 times a day for 10 minutes each time.  Do exercises as told by your health care provider. Medicines  In addition to diet and lifestyle changes, your health care provider may recommend medicines to help lower cholesterol. This may be a medicine to lower the amount of cholesterol your liver makes. You may need medicine if: ? Diet and lifestyle changes do not lower your cholesterol enough. ?  You have high cholesterol and other risk factors for heart disease or stroke.  Take over-the-counter and prescription medicines only as told by your health care provider. General information  Manage your risk factors for high cholesterol. Talk with your health care provider about all your risk factors and how to lower your risk.  Manage other conditions  that you have, such as diabetes or high blood pressure (hypertension).  Have blood tests to check your cholesterol levels at regular points in time as told by your health care provider.  Keep all follow-up visits as told by your health care provider. This is important. Where to find more information  American Heart Association: www.heart.org  National Heart, Lung, and Blood Institute: [employer].iswww.nhlbi.nih.gov Summary  High cholesterol increases your risk for heart disease and stroke. By keeping your cholesterol level low, you can reduce your risk for these conditions.  High cholesterol can often be prevented with diet and lifestyle changes.  Work with your health care provider to manage your risk factors, and have your blood tested regularly. This information is not intended to replace advice given to you by your health care provider. Make sure you discuss any questions you have with your health care provider. Document Released: 11/30/2015 Document Revised: 03/09/2019 Document Reviewed: 07/24/2016 Elsevier Patient Education  2020 [employer]

## 2019-08-02 LAB — LIPID PANEL
Cholesterol: 226 mg/dL — ABNORMAL HIGH (ref <200)
HDL: 33 mg/dL — ABNORMAL LOW (ref >=40)
Non-HDL Cholesterol (Calc): 193 mg/dL (calc) — ABNORMAL HIGH (ref <130)
Total CHOL/HDL Ratio: 6.8 (calc) — ABNORMAL HIGH (ref <5.0)
Triglycerides: 441 mg/dL — ABNORMAL HIGH (ref <150)

## 2019-08-02 LAB — COMPLETE METABOLIC PANEL WITH GFR
AG Ratio: 1.8 (calc) (ref 1.0–2.5)
ALT: 15 U/L (ref 9–46)
AST: 16 U/L (ref 10–40)
Albumin: 4.2 g/dL (ref 3.6–5.1)
Alkaline phosphatase (APISO): 61 U/L (ref 36–130)
BUN: 18 mg/dL (ref 7–25)
CO2: 26 mmol/L (ref 20–32)
Calcium: 9.4 mg/dL (ref 8.6–10.3)
Chloride: 106 mmol/L (ref 98–110)
Creat: 0.95 mg/dL (ref 0.60–1.35)
GFR, Est African American: 113 mL/min/{1.73_m2} (ref >=60)
GFR, Est Non African American: 98 mL/min/{1.73_m2} (ref >=60)
Globulin: 2.4 g/dL (calc) (ref 1.9–3.7)
Glucose, Bld: 91 mg/dL (ref 65–99)
Potassium: 4.3 mmol/L (ref 3.5–5.3)
Sodium: 138 mmol/L (ref 135–146)
Total Bilirubin: 0.3 mg/dL (ref 0.2–1.2)
Total Protein: 6.6 g/dL (ref 6.1–8.1)

## 2019-09-05 ENCOUNTER — Other Ambulatory Visit: Payer: Self-pay

## 2019-09-05 DIAGNOSIS — Z20822 Contact with and (suspected) exposure to covid-19: Secondary | ICD-10-CM

## 2019-09-07 LAB — NOVEL CORONAVIRUS, NAA: SARS-CoV-2, NAA: NOT DETECTED

## 2019-09-28 ENCOUNTER — Encounter

## 2019-11-07 NOTE — Progress Notes (Deleted)
FOLLOW UP  Assessment and Plan:   Cholesterol Currently above goal; very poorly compliant with medications - never started rosuvastatin 5 mg daily ** Extended discussion of risks of MI, CVA, ED, dementia with untreated severe hyperlipidemia, with smoking history and family cardiac history  He is agreeable to starting Continue low cholesterol diet and exercise.  Check lipid panel.   Other abnormal glucose Continue diet and exercise.  Perform daily foot/skin check, notify office of any concerning changes.  Check A1C at CPE; last was normal; monitor serum glucose, weight   Overweight Long discussion about weight loss, diet, and exercise Recommended diet heavy in fruits and veggies and low in animal meats, cheeses, and dairy products, appropriate calorie intake Discussed ideal weight for height  Patient will work on cutting down on meat intake, portions, highly processed foods Will follow up in 3 months  Vitamin D Def Below goal at last visit; he has not initiated supplementation Encouraged supplementation for goal of 60-100 Defer Vit D level to CPE  Anxiety Off medications and reportedly doing well  Stress management techniques discussed, increase water, good sleep hygiene discussed, increase exercise, and increase veggies.   Tobacco use Discussed risks associated with tobacco use and advised to reduce or quit Patient is not ready to do so, but advised to consider strongly; resources provided on AVS, declines chantix or other medication Will follow up at the next visit  Alcohol use disorder, moderate, in early remission [facility]) Patient reports not longer binge drinking; off of naltrexone and reports continues to do well    Continue diet and meds as discussed. Further disposition pending results of labs. Discussed med's effects and SE's.   Over 30 minutes of exam, counseling, chart review, and critical decision making was performed.   Future Appointments  Date Time Provider  [place]  11/08/2019  8:45 AM Liane Comber, NP [facility] None  01/14/2020 10:00 AM Liane Comber, NP [facility] None    ----------------------------------------------------------------------------------------------------------------------  HPI 43 y.o. male, married with 4 children, presents for 3 month follow up on cholesterol, glucose management, weight, smoking/bringe drinking, anxiety and vitamin D deficiency.    He has hx of alcohol binge- drinking and earlier this year was admitted to inpatient 4 week intensive treatment at [facility] in [place]. He reports he is doing very well after this, strongly recommends program, was doing AA nearly daily but none since the pandemic, has been sober since 12/04/2018. He was on naltrexone 50 mg but has since tapered off and reports he continues to do well. "Not a problem any more." He does still communicate with people he met through [employer] and [employer].   Had anxiety, didn't tolerate medications (celexa, zoloft, wellbutrin) well but denies problems since quitting alcohol.   he currently continues to smoke 0.5 pack a day; discussed risks associated with smoking, patient is not ready to quit at this time, but is interested in doing so in the future with awareness of risks.   BMI is There is no height or weight on file to calculate BMI., he has not been working on diet and exercise; he reports he is active during the day and rarely sits, works a physically intense job.  Wt Readings from Last 3 Encounters:  08/01/19 201 lb (91.2 kg)  01/11/19 202 lb (91.6 kg)  12/07/18 191 lb (86.6 kg)   Today their BP is    He does not workout. He denies chest pain, shortness of breath, dizziness.   He is on cholesterol  medication - prescribed rosuvastatin 5 mg daily. He denies having myalgias on medication. His cholesterol is not at goal. The cholesterol last visit was:   Lab Results  Component Value Date   CHOL 226 (H) 08/01/2019   HDL 33  (L) 08/01/2019   [place]  08/01/2019     Comment:     . LDL cholesterol not calculated. Triglyceride levels greater than 400 mg/dL invalidate calculated LDL results. . Reference range: <100 . Desirable range <100 mg/dL for primary prevention;   <70 mg/dL for patients with CHD or diabetic patients  with > or = 2 CHD risk factors. Marland Kitchen LDL-C is now calculated using the Martin-Hopkins  calculation, which is a validated novel method providing  better accuracy than the Friedewald equation in the  estimation of LDL-C.  Cresenciano Genre et al. Annamaria Helling. 4034;742(59): 2061-2068  (http://education.[facility].com/faq/FAQ164)    TRIG 441 (H) 08/01/2019   CHOLHDL 6.8 (H) 08/01/2019    He has not been working on diet and exercise for glucose management, and denies hyperglycemia, increased appetite, nausea, paresthesia of the feet, polydipsia and polyuria. Last A1C in the office was:  Lab Results  Component Value Date   HGBA1C 5.6 01/11/2019   Patient is not on Vitamin D supplement despite recommendation.    Lab Results  Component Value Date   VD25OH 19 (L) 01/11/2019       Current Medications:  Current Outpatient Medications on File Prior to Visit  Medication Sig  . rosuvastatin (CRESTOR) 5 MG tablet Take 1 tablet Daily or as Directed for Cholesterol   No current facility-administered medications on file prior to visit.      Allergies:  Allergies  Allergen Reactions  . Celexa [Citalopram Hydrobromide]     No relief  . Pravastatin     Myalgias  . Wellbutrin [Bupropion]     No relief  . Zoloft [Sertraline Hcl]     aggitation     Medical History:  Past Medical History:  Diagnosis Date  . 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 Generalized anxiety disorder 02/27/2015  . Hyperlipidemia   . Prediabetes 12/29/2017  . Traumatic leg injury 11/28/2013  . Vitamin D deficiency    Family history- Reviewed and unchanged Social history- Reviewed and unchanged   Review of  Systems:  Review of Systems  Constitutional: Negative for malaise/fatigue and weight loss.  HENT: Negative for hearing loss and tinnitus.   Eyes: Negative for blurred vision and double vision.  Respiratory: Negative for cough, shortness of breath and wheezing.   Cardiovascular: Negative for chest pain, palpitations, orthopnea, claudication and leg swelling.  Gastrointestinal: Negative for abdominal pain, blood in stool, constipation, diarrhea, heartburn, melena, nausea and vomiting.  Genitourinary: Negative.   Musculoskeletal: Negative for joint pain and myalgias.  Skin: Negative for rash.  Neurological: Negative for dizziness, tingling, sensory change, weakness and headaches.  Endo/Heme/Allergies: Negative for polydipsia.  Psychiatric/Behavioral: Negative for depression, hallucinations, substance abuse and suicidal ideas. The patient is not nervous/anxious and does not have insomnia.   All other systems reviewed and are negative.    Physical Exam: There were no vitals taken for this visit. Wt Readings from Last 3 Encounters:  08/01/19 201 lb (91.2 kg)  01/11/19 202 lb (91.6 kg)  12/07/18 191 lb (86.6 kg)   General Appearance: Well nourished, in no apparent distress. Eyes: PERRLA, EOMs, conjunctiva no swelling or erythema Sinuses: No Frontal/maxillary tenderness ENT/Mouth: Ext aud canals clear, TMs without erythema, bulging. No erythema, swelling, or exudate on post pharynx.  Tonsils not swollen or erythematous. Hearing normal.  Neck: Supple, thyroid normal.  Respiratory: Respiratory effort normal, BS equal bilaterally without rales, rhonchi, wheezing or stridor.  Cardio: RRR with no MRGs. Brisk peripheral pulses without edema.  Abdomen: Soft, + BS.  Non tender, no guarding, rebound, hernias, masses. Lymphatics: Non tender without lymphadenopathy.  Musculoskeletal: Full ROM, 5/5 strength, Normal gait Skin: Warm, dry without rashes, lesions, ecchymosis.  Neuro: Cranial nerves  intact. No cerebellar symptoms.  Psych: Awake and oriented X 3, normal affect, Insight and Judgment appropriate.    Izora Ribas, NP 7:53 AM [facility]

## 2019-11-08 ENCOUNTER — Ambulatory Visit: Payer: 59 | Admitting: Adult Health

## 2019-11-08 DIAGNOSIS — I10 Essential (primary) hypertension: Secondary | ICD-10-CM

## 2020-01-08 ENCOUNTER — Ambulatory Visit: Payer: 59 | Attending: Internal Medicine

## 2020-01-08 DIAGNOSIS — Z20822 Contact with and (suspected) exposure to covid-19: Secondary | ICD-10-CM | POA: Insufficient documentation

## 2020-01-09 LAB — NOVEL CORONAVIRUS, NAA: SARS-CoV-2, NAA: NOT DETECTED

## 2020-01-14 ENCOUNTER — Encounter: Payer: Self-pay | Admitting: Adult Health

## 2020-01-17 NOTE — Progress Notes (Signed)
Complete Physical  Assessment and Plan:  Diagnoses and all orders for this visit:  Encounter for routine adult physical exam with abnormal findings  Hyperlipidemia, unspecified hyperlipidemia type At last check above goal; rosuvastatin 5 mg didn't tolerate daily, check lipids then consider low frequency dosing Continue low cholesterol diet and exercise.  Check lipid panel.  -     Lipid panel -     TSH  Vitamin D deficiency Hasn't started supplement; retry 5000 IU daily, if can't tolerate then try 1000-2000 IU daily  Defer checking level, historically has been persistently low   Traumatic injury of right lower extremity, sequela Remote; no issues  Screening for cardiovascular condition -     EKG 12-Lead  Screening for hematuria or proteinuria -     Urinalysis w microscopic + reflex cultur  Medication management -     CBC with Differential/Platelet -     CMP/GFR -     Magnesium   Prediabetes Hx of prediabetes recently improved after quitting ETOH Discussed disease and risks Discussed diet/exercise, weight management  -     Hemoglobin A1c  Early remission alcohol abuse (HCC) Sober since 12/04/[facility] with [facility] He feels very positive and confident to maintain  Obesity (BMI 30.0-34.9) Long discussion about weight loss, diet, and exercise Recommended diet heavy in fruits and veggies and low in animal meats, cheeses, and dairy products, appropriate calorie intake Discussed appropriate weight for height, weight goal <185 lb discussed and set Follow up at 3 months  Tobacco use Discussed risks associated with tobacco use and advised to reduce or quit Patient not ready to do so; ambivalent; risks discussed at length, information given  Declines Chantix or other medication, has tried wellbutrin  Will follow up at the next visit - CXR  High risk sexual history; screening STDs HIV, RPR, gonorrhea/chlamygia, HSV1/2   Discussed med's effects and SE's. Screening labs  and tests as requested with regular follow-up as recommended. Over 40 minutes of exam, counseling, chart review and critical decision making was performed  Future Appointments  Date Time Provider Department Center  01/19/2021  9:00 AM [name], NP [facility] None     HPI BP 110/62   Pulse (!) 54   Temp (!) 97.3 F (36.3 C)   Ht 5' 7.25" (1.708 m)   Wt 198 lb 9.6 oz (90.1 kg)   SpO2 97%   BMI 30.87 kg/m   Patient is a 44 y.o. Caucasian married male,  who presents for a complete physical. He has Hyperlipidemia; Vitamin D deficiency; Generalized anxiety disorder; Other abnormal glucose; Overweight (BMI 25.0-29.9); Alcohol use disorder, moderate, in early remission (HCC); and Smoker on their problem list.  Married with 4 children, 22-29 years old, 2 boys 2 girls, works with industrial pipe fitting   No concerns today. Discussed STD testing today; he reports married without new partners, but hx of 10+ partners and doesn't think he's every had screening.    He has hx of alcohol binge- drinking and recently was admitted to inpatient 4 week intensive treatment at [facility] in [place]. He reports he is doing very well after this, strongly recommends program, doing AA nearly daily, has been sober since 12/04/2018. He is on naltrexone 50 mg and feels he is doing well with this. Had anxiety, didn't tolerate medications well but denies problems since quitting alcohol.   he currently continues to smoke 0.5 pack a day; discussed risks associated with smoking, patient is not ready to quit at this time,  but is interested in doing so in the future with awareness of risks.   BMI is Body mass index is 30.87 kg/m., he has been working on diet and exercise, he is very active with job, plays sports (baseball seasonally), lifts a lot at work.. He does make sure to eat fruits and salads, avoids fried foods, limited bread.  Coffee does drink a pot a day. He drinks 20 ounces of water x 3-4 times  daily.  He gained up to 220 lb in rehab, is in the process of losing weight.  Wt Readings from Last 3 Encounters:  01/18/20 198 lb 9.6 oz (90.1 kg)  08/01/19 201 lb (91.2 kg)  01/11/19 202 lb (91.6 kg)   Today their BP is BP: 110/62 He does not workout, but is at a physically intense job. He denies chest pain, shortness of breath, dizziness.   He is not on cholesterol medication, was on pravastatin and had myalgias, but has been on rosuvastatin 5 mg daily, took for a week or so but then started feeling very weak and dizzy and stopped taking.  His cholesterol is not at goal. The cholesterol last visit was:   Lab Results  Component Value Date   CHOL 226 (H) 08/01/2019   HDL 33 (L) 08/01/2019   LDLCALC  08/01/2019     Comment:     . LDL cholesterol not calculated. Triglyceride levels greater than 400 mg/dL invalidate calculated LDL results. . Reference range: <100 . Desirable range <100 mg/dL for primary prevention;   <70 mg/dL for patients with CHD or diabetic patients  with > or = 2 CHD risk factors. Marland Kitchen LDL-C is now calculated using the Martin-Hopkins  calculation, which is a validated novel method providing  better accuracy than the Friedewald equation in the  estimation of LDL-C.  Horald Pollen et al. Lenox Ahr. 9563;875(64): 2061-2068  (http://education.[facility].com/faq/FAQ164)    TRIG 441 (H) 08/01/2019   CHOLHDL 6.8 (H) 08/01/2019   He has not been working on diet and exercise for prediabetes (predating 2014, 5.9% at that time) recently improved to normal range, he is not on bASA, he is not on ACE/ARB and denies increased appetite, nausea, paresthesia of the feet, polydipsia, polyuria, visual disturbances, vomiting and weight loss. Last A1C in the office was:  Lab Results  Component Value Date   HGBA1C 5.6 01/11/2019   Last GFR: Lab Results  Component Value Date   GFRNONAA 98 08/01/2019   Patient is not on Vitamin D supplement and low at last check:    Lab Results   Component Value Date   VD25OH 19 (L) 01/11/2019       Current Medications:  Current Outpatient Medications on File Prior to Visit  Medication Sig Dispense Refill  . rosuvastatin (CRESTOR) 5 MG tablet Take 1 tablet Daily or as Directed for Cholesterol (Patient not taking: Reported on 01/18/2020) 90 tablet 0   No current facility-administered medications on file prior to visit.   Allergies:  Allergies  Allergen Reactions  . Celexa [Citalopram Hydrobromide]     No relief  . Pravastatin     Myalgias  . Wellbutrin [Bupropion]     No relief  . Zoloft [Sertraline Hcl]     aggitation   Health Maintenance:  Immunization History  Administered Date(s) Administered  . PPD Test 11/27/2013, 02/26/2015  . Td 11/29/2008  . Tdap 11/27/2013   Tetanus: 2014 Flu vaccine: Declines  Colonoscopy: n/a EGD: n/a  Eye Exam: 2015, no issues Dentist: Never -  Encouraged to schedule at least annually  Patient Care Team: Unk Pinto, MD as PCP - General (Internal Medicine) Dorna Leitz, MD as Consulting Physician (Orthopedic Surgery) Frederik Pear, MD as Consulting Physician (Orthopedic Surgery)  Medical History:  has Hyperlipidemia; Vitamin D deficiency; Generalized anxiety disorder; Other abnormal glucose; Overweight (BMI 25.0-29.9); Alcohol use disorder, moderate, in early remission ([facility]); and Smoker on their problem list. Surgical History:  He  has a past surgical history that includes Debridement leg (Right); Ankle fracture surgery (Right, 1996); and Vasectomy (2014). Family History:  His family history includes Alcohol abuse in his paternal grandfather; Aneurysm in his maternal grandfather; Cancer in his mother; Depression in his father; Diabetes in his maternal aunt and sister; Heart disease in his maternal grandfather; Lung cancer in his paternal uncle; Stroke in his father. Social History:   reports that he has been smoking cigarettes. He started smoking about 33 years ago. He has  a 16.50 pack-year smoking history. He has never used smokeless tobacco. He reports previous alcohol use. He reports that he does not use drugs. Review of Systems:  Review of Systems  Constitutional: Negative for malaise/fatigue and weight loss.  HENT: Negative for hearing loss and tinnitus.   Eyes: Negative for blurred vision and double vision.  Respiratory: Negative for cough, shortness of breath and wheezing.   Cardiovascular: Negative for chest pain, palpitations, orthopnea, claudication and leg swelling.  Gastrointestinal: Negative for abdominal pain, blood in stool, constipation, diarrhea, heartburn, melena, nausea and vomiting.  Genitourinary: Negative.   Musculoskeletal: Negative for joint pain and myalgias.  Skin: Negative for rash.  Neurological: Negative for dizziness, tingling, sensory change, weakness and headaches.  Endo/Heme/Allergies: Negative for polydipsia.  Psychiatric/Behavioral: Negative for depression, hallucinations, memory loss, substance abuse and suicidal ideas. The patient is not nervous/anxious and does not have insomnia.   All other systems reviewed and are negative.   Physical Exam: Estimated body mass index is 30.87 kg/m as calculated from the following:   Height as of this encounter: 5' 7.25" (1.708 m).   Weight as of this encounter: 198 lb 9.6 oz (90.1 kg). BP 110/62   Pulse (!) 54   Temp (!) 97.3 F (36.3 C)   Ht 5' 7.25" (1.708 m)   Wt 198 lb 9.6 oz (90.1 kg)   SpO2 97%   BMI 30.87 kg/m  General Appearance: Well nourished, in no apparent distress.  Eyes: PERRLA, EOMs, conjunctiva no swelling or erythema, normal fundi and vessels.  Sinuses: No Frontal/maxillary tenderness  ENT/Mouth: Ext aud canals clear, normal light reflex with TMs without erythema, bulging. Poor dentition with halitosis; tongue appears large, smooth, somewhat red. No erythema, swelling, or exudate on post pharynx. Tonsils not swollen or erythematous. Hearing normal.  Neck:  Supple, thyroid normal. No bruits  Respiratory: Respiratory effort normal, BS equal bilaterally without rales, rhonchi, wheezing or stridor.  Cardio: RRR without murmurs, rubs or gallops. Brisk peripheral pulses without edema.  Chest: symmetric, with normal excursions and percussion.  Abdomen: Soft, nontender, no guarding, rebound, hernias, masses, or organomegaly.  Lymphatics: Non tender without lymphadenopathy.  Genitourinary: No concerns; defer Musculoskeletal: Full ROM all peripheral extremities,5/5 strength, and normal gait.  Skin: Warm, dry without rashes, lesions, ecchymosis. Neuro: Cranial nerves intact, reflexes equal bilaterally. Normal muscle tone, no cerebellar symptoms. Sensation intact.  Psych: Awake and oriented X 3, normal affect, Insight and Judgment appropriate.   EKG: WNL - sinus brady - no changes.  Izora Ribas 10:31 AM [facility]

## 2020-01-18 ENCOUNTER — Other Ambulatory Visit: Payer: Self-pay

## 2020-01-18 ENCOUNTER — Ambulatory Visit: Payer: 59 | Admitting: Adult Health

## 2020-01-18 ENCOUNTER — Encounter: Payer: Self-pay | Admitting: Adult Health

## 2020-01-18 VITALS — BP 110/62 | HR 54 | Temp 97.3°F | Ht 67.25 in | Wt 198.6 lb

## 2020-01-18 DIAGNOSIS — F411 Generalized anxiety disorder: Secondary | ICD-10-CM

## 2020-01-18 DIAGNOSIS — Z136 Encounter for screening for cardiovascular disorders: Secondary | ICD-10-CM | POA: Diagnosis not present

## 2020-01-18 DIAGNOSIS — Z131 Encounter for screening for diabetes mellitus: Secondary | ICD-10-CM

## 2020-01-18 DIAGNOSIS — R030 Elevated blood-pressure reading, without diagnosis of hypertension: Secondary | ICD-10-CM

## 2020-01-18 DIAGNOSIS — F1021 Alcohol dependence, in remission: Secondary | ICD-10-CM

## 2020-01-18 DIAGNOSIS — Z113 Encounter for screening for infections with a predominantly sexual mode of transmission: Secondary | ICD-10-CM

## 2020-01-18 DIAGNOSIS — E782 Mixed hyperlipidemia: Secondary | ICD-10-CM

## 2020-01-18 DIAGNOSIS — Z0000 Encounter for general adult medical examination without abnormal findings: Secondary | ICD-10-CM | POA: Diagnosis not present

## 2020-01-18 DIAGNOSIS — Z7251 High risk heterosexual behavior: Secondary | ICD-10-CM

## 2020-01-18 DIAGNOSIS — E559 Vitamin D deficiency, unspecified: Secondary | ICD-10-CM

## 2020-01-18 DIAGNOSIS — E663 Overweight: Secondary | ICD-10-CM

## 2020-01-18 DIAGNOSIS — R7309 Other abnormal glucose: Secondary | ICD-10-CM

## 2020-01-18 DIAGNOSIS — F17200 Nicotine dependence, unspecified, uncomplicated: Secondary | ICD-10-CM

## 2020-01-18 DIAGNOSIS — Z0001 Encounter for general adult medical examination with abnormal findings: Secondary | ICD-10-CM

## 2020-01-18 NOTE — Patient Instructions (Addendum)
Tracy Barrett , Thank you for taking time to come for your Annual Wellness Visit. I appreciate your ongoing commitment to your health goals. Please review the following plan we discussed and let me know if I can assist you in the future.   These are the goals we discussed: Goals    . LDL CALC < 100    . Reduce caffeine intake     Cut back to 2-3 cups of coffee per day    . Reduce/quit smoking    . Weight (lb) < 185 lb (83.9 kg)       This is a list of the screening recommended for you and due dates:  Health Maintenance  Topic Date Due  . Flu Shot  02/27/2020*  . Tetanus Vaccine  11/28/2023  . HIV Screening  Completed  *Topic was postponed. The date shown is not the original due date.    Please get a chest xray at [address] - just walk in and give your name  Please schedule a dental exam   Please get on daily vitamin D supplement - 5000 IU /day, if can't tolerate, try 1000-2000 IU daily     Know what a healthy weight is for you (roughly BMI <25) and aim to maintain this  Aim for 7+ servings of fruits and vegetables daily  65-80+ fluid ounces of water or unsweet tea for healthy kidneys  Limit to max 1 drink of alcohol per day; avoid smoking/tobacco  Limit animal fats in diet for cholesterol and heart health - choose grass fed whenever available  Avoid highly processed foods, and foods high in saturated/trans fats  Aim for low stress - take time to unwind and care for your mental health  Aim for 150 min of moderate intensity exercise weekly for heart health, and weights twice weekly for bone health  Aim for 7-9 hours of sleep daily    SMOKING CESSATION  [employer] society  24401027253 for more information or for a free program for smoking cessation help.   You can call QUIT SMART 1-800-QUIT-NOW for free nicotine patches or replacement therapy- if they are out- keep calling  [facility] Can call for smoking cessation classes,  [phone]  If you have a smart phone, please look up Smoke Free app, this will help you stay on track and give you information about money you have saved, life that you have gained back and a ton of more information.     ADVANTAGES OF QUITTING SMOKING  Within 20 minutes, blood pressure decreases. Your pulse is at normal level.  After 8 hours, carbon monoxide levels in the blood return to normal. Your oxygen level increases.  After 24 hours, the chance of having a heart attack starts to decrease. Your breath, hair, and body stop smelling like smoke.  After 48 hours, damaged nerve endings begin to recover. Your sense of taste and smell improve.  After 72 hours, the body is virtually free of nicotine. Your bronchial tubes relax and breathing becomes easier.  After 2 to 12 weeks, lungs can hold more air. Exercise becomes easier and circulation improves.  After 1 year, the risk of coronary heart disease is cut in half.  After 5 years, the risk of stroke falls to the same as a nonsmoker.  After 10 years, the risk of lung cancer is cut in half and the risk of other cancers decreases significantly.  After 15 years, the risk of coronary heart disease drops, usually  to the level of a nonsmoker.  You will have extra money to spend on things other than cigarettes.      High Triglycerides Eating Plan Triglycerides are a type of fat in the blood. High levels of triglycerides can increase your risk of heart disease and stroke. If your triglyceride levels are high, choosing the right foods can help lower your triglycerides and keep your heart healthy. Work with your health care provider or a diet and nutrition specialist (dietitian) to develop an eating plan that is right for you. What are tips for following this plan? General guidelines   Lose weight, if you are overweight. For most people, losing 5-10 lbs (2-5 kg) helps lower triglyceride levels. A weight-loss plan may include. ? 30  minutes of exercise at least 5 days a week. ? Reducing the amount of calories, sugar, and fat you eat.  Eat a wide variety of fresh fruits, vegetables, and whole grains. These foods are high in fiber.  Eat foods that contain healthy fats, such as fatty fish, nuts, seeds, and olive oil.  Avoid foods that are high in added sugar, added salt (sodium), saturated fat, and trans fat.  Avoid low-fiber, refined carbohydrates such as white bread, crackers, noodles, and white rice.  Avoid foods with partially hydrogenated oils (trans fats), such as fried foods or stick margarine.  Limit alcohol intake to no more than 1 drink a day for nonpregnant women and 2 drinks a day for men. One drink equals 12 oz of beer, 5 oz of wine, or 1 oz of hard liquor. Your health care provider may recommend that you drink less depending on your overall health. Reading food labels  Check food labels for the amount of saturated fat. Choose foods with no or very little saturated fat.  Check food labels for the amount of trans fat. Choose foods with no trans fat.  Check food labels for the amount of cholesterol. Choose foods low in cholesterol. Ask your dietitian how much cholesterol you should have each day.  Check food labels for the amount of sodium. Choose foods with less than 140 milligrams (mg) per serving. Shopping  Buy dairy products labeled as nonfat (skim) or low-fat (1%).  Avoid buying processed or prepackaged foods. These are often high in added sugar, sodium, and fat. Cooking  Choose healthy fats when cooking, such as olive oil or canola oil.  Cook foods using lower fat methods, such as baking, broiling, boiling, or grilling.  Make your own sauces, dressings, and marinades when possible, instead of buying them. Store-bought sauces, dressings, and marinades are often high in sodium and sugar. Meal planning  Eat more home-cooked food and less restaurant, buffet, and fast food.  Eat fatty fish at  least 2 times each week. Examples of fatty fish include salmon, trout, mackerel, tuna, and herring.  If you eat whole eggs, do not eat more than 3 egg yolks per week. What foods are recommended? The items listed may not be a complete list. Talk with your dietitian about what dietary choices are best for you. Grains Whole wheat or whole grain breads, crackers, cereals, and pasta. Unsweetened oatmeal. Bulgur. Barley. Quinoa. Brown rice. Whole wheat flour tortillas. Vegetables Fresh or frozen vegetables. Low-sodium canned vegetables. Fruits All fresh, canned (in natural juice), or frozen fruits. Meats and other protein foods Skinless chicken or [place]. Ground chicken or [place]. Lean cuts of pork, trimmed of fat. Fish and seafood, especially salmon, trout, and herring. Egg whites. Dried beans, peas,  or lentils. Unsalted nuts or seeds. Unsalted canned beans. Natural peanut or almond butter. Dairy Low-fat dairy products. Skim or low-fat (1%) milk. Reduced fat (2%) and low-sodium cheese. Low-fat ricotta cheese. Low-fat cottage cheese. Plain, low-fat yogurt. Fats and oils Tub margarine without trans fats. Light or reduced-fat mayonnaise. Light or reduced-fat salad dressings. Avocado. Safflower, olive, sunflower, soybean, and canola oils. What foods are not recommended? The items listed may not be a complete list. Talk with your dietitian about what dietary choices are best for you. Grains White bread. White (regular) pasta. White rice. Cornbread. Bagels. Pastries. Crackers that contain trans fat. Vegetables Creamed or fried vegetables. Vegetables in a cheese sauce. Fruits Sweetened dried fruit. Canned fruit in syrup. Fruit juice. Meats and other protein foods Fatty cuts of meat. Ribs. Chicken wings. Berniece Salines. Sausage. Bologna. Salami. Chitterlings. Fatback. Hot dogs. Bratwurst. Packaged lunch meats. Dairy Whole or reduced-fat (2%) milk. Half-and-half. Cream cheese. Full-fat or sweetened yogurt.  Full-fat cheese. Nondairy creamers. Whipped toppings. Processed cheese or cheese spreads. Cheese curds. Beverages Alcohol. Sweetened drinks, such as soda, lemonade, fruit drinks, or punches. Fats and oils Butter. Stick margarine. Lard. Shortening. Ghee. Bacon fat. Tropical oils, such as coconut, palm kernel, or palm oils. Sweets and desserts Corn syrup. Sugars. Honey. Molasses. Candy. Jam and jelly. Syrup. Sweetened cereals. Cookies. Pies. Cakes. Donuts. Muffins. Ice cream. Condiments Store-bought sauces, dressings, and marinades that are high in sugar, such as ketchup and barbecue sauce. Summary  High levels of triglycerides can increase the risk of heart disease and stroke. Choosing the right foods can help lower your triglycerides.  Eat plenty of fresh fruits, vegetables, and whole grains. Choose low-fat dairy and lean meats. Eat fatty fish at least twice a week.  Avoid processed and prepackaged foods with added sugar, sodium, saturated fat, and trans fat.  If you need suggestions or have questions about what types of food are good for you, talk with your health care provider or a dietitian. This information is not intended to replace advice given to you by your health care provider. Make sure you discuss any questions you have with your health care provider. Document Revised: 10/28/2017 Document Reviewed: 01/18/2017 Elsevier Patient Education  2020 [employer].

## 2020-01-18 NOTE — Addendum Note (Signed)
Addended by: [name] on: 01/18/2020 11:46 AM   Modules accepted: Orders

## 2020-01-19 ENCOUNTER — Other Ambulatory Visit: Payer: Self-pay | Admitting: Adult Health

## 2020-01-19 MED ORDER — ROSUVASTATIN CALCIUM 5 MG PO TABS: ORAL_TABLET | ORAL | 0 refills | Status: DC

## 2020-01-21 ENCOUNTER — Encounter: Payer: Self-pay | Admitting: Adult Health

## 2020-01-21 LAB — COMPLETE METABOLIC PANEL WITH GFR
AG Ratio: 2 (calc) (ref 1.0–2.5)
ALT: 15 U/L (ref 9–46)
AST: 14 U/L (ref 10–40)
Albumin: 4.4 g/dL (ref 3.6–5.1)
Alkaline phosphatase (APISO): 73 U/L (ref 36–130)
BUN: 12 mg/dL (ref 7–25)
CO2: 26 mmol/L (ref 20–32)
Calcium: 9.5 mg/dL (ref 8.6–10.3)
Chloride: 105 mmol/L (ref 98–110)
Creat: 0.9 mg/dL (ref 0.60–1.35)
GFR, Est African American: 120 mL/min/{1.73_m2} (ref >=60)
GFR, Est Non African American: 104 mL/min/{1.73_m2} (ref >=60)
Globulin: 2.2 g/dL (calc) (ref 1.9–3.7)
Glucose, Bld: 92 mg/dL (ref 65–99)
Potassium: 4.6 mmol/L (ref 3.5–5.3)
Sodium: 139 mmol/L (ref 135–146)
Total Bilirubin: 0.4 mg/dL (ref 0.2–1.2)
Total Protein: 6.6 g/dL (ref 6.1–8.1)

## 2020-01-21 LAB — LIPID PANEL
Cholesterol: 241 mg/dL — ABNORMAL HIGH (ref <200)
HDL: 35 mg/dL — ABNORMAL LOW (ref >=40)
LDL Cholesterol (Calc): 173 mg/dL (calc) — ABNORMAL HIGH
Non-HDL Cholesterol (Calc): 206 mg/dL (calc) — ABNORMAL HIGH (ref <130)
Total CHOL/HDL Ratio: 6.9 (calc) — ABNORMAL HIGH (ref <5.0)
Triglycerides: 173 mg/dL — ABNORMAL HIGH (ref <150)

## 2020-01-21 LAB — URINALYSIS, ROUTINE W REFLEX MICROSCOPIC
Bilirubin Urine: NEGATIVE
Glucose, UA: NEGATIVE
Hgb urine dipstick: NEGATIVE
Ketones, ur: NEGATIVE
Leukocytes,Ua: NEGATIVE
Nitrite: NEGATIVE
Protein, ur: NEGATIVE
Specific Gravity, Urine: 1.024 (ref 1.001–1.03)
pH: <=5 (ref 5.0–8.0)

## 2020-01-21 LAB — HSV(HERPES SIMPLEX VRS) I + II AB-IGG
HAV 1 IGG,TYPE SPECIFIC AB: 34.5 index — ABNORMAL HIGH
HSV 2 IGG,TYPE SPECIFIC AB: <0.9 index

## 2020-01-21 LAB — CBC WITH DIFFERENTIAL/PLATELET
Absolute Monocytes: 742 cells/uL (ref 200–950)
Basophils Absolute: 32 cells/uL (ref 0–200)
Basophils Relative: 0.3 %
Eosinophils Absolute: 159 cells/uL (ref 15–500)
Eosinophils Relative: 1.5 %
HCT: 45.6 % (ref 38.5–50.0)
Hemoglobin: 15.4 g/dL (ref 13.2–17.1)
Lymphs Abs: 2979 cells/uL (ref 850–3900)
MCH: 31.4 pg (ref 27.0–33.0)
MCHC: 33.8 g/dL (ref 32.0–36.0)
MCV: 92.9 fL (ref 80.0–100.0)
MPV: 11.4 fL (ref 7.5–12.5)
Monocytes Relative: 7 %
Neutro Abs: 6689 cells/uL (ref 1500–7800)
Neutrophils Relative %: 63.1 %
Platelets: 209 10*3/uL (ref 140–400)
RBC: 4.91 10*6/uL (ref 4.20–5.80)
RDW: 12.6 % (ref 11.0–15.0)
Total Lymphocyte: 28.1 %
WBC: 10.6 10*3/uL (ref 3.8–10.8)

## 2020-01-21 LAB — HIV ANTIBODY (ROUTINE TESTING W REFLEX): HIV 1&2 Ab, 4th Generation: NONREACTIVE

## 2020-01-21 LAB — HEMOGLOBIN A1C
Hgb A1c MFr Bld: 5.7 % of total Hgb — ABNORMAL HIGH (ref <5.7)
Mean Plasma Glucose: 117 (calc)
eAG (mmol/L): 6.5 (calc)

## 2020-01-21 LAB — C. TRACHOMATIS/N. GONORRHOEAE RNA
C. trachomatis RNA, TMA: NOT DETECTED
N. gonorrhoeae RNA, TMA: NOT DETECTED

## 2020-01-21 LAB — RPR: RPR Ser Ql: NONREACTIVE

## 2020-01-21 LAB — MAGNESIUM: Magnesium: 2.1 mg/dL (ref 1.5–2.5)

## 2020-01-21 LAB — TSH: TSH: 2.58 mIU/L (ref 0.40–4.50)

## 2020-01-25 ENCOUNTER — Ambulatory Visit
Admission: RE | Admit: 2020-01-25 | Discharge: 2020-01-25 | Disposition: A | Discharge status: Home / Self Care | Payer: 59 | Source: Ambulatory Visit | Attending: Adult Health | Admitting: Adult Health

## 2020-01-25 ENCOUNTER — Other Ambulatory Visit: Payer: Self-pay

## 2020-01-25 DIAGNOSIS — F17200 Nicotine dependence, unspecified, uncomplicated: Secondary | ICD-10-CM

## 2020-04-12 ENCOUNTER — Other Ambulatory Visit: Payer: Self-pay | Admitting: Adult Health

## 2020-04-14 ENCOUNTER — Ambulatory Visit: Payer: 59 | Admitting: Adult Health

## 2020-04-14 ENCOUNTER — Other Ambulatory Visit: Payer: Self-pay

## 2020-04-14 ENCOUNTER — Encounter: Payer: Self-pay | Admitting: Adult Health

## 2020-04-14 VITALS — BP 126/80 | HR 66 | Temp 97.5°F | Wt 192.0 lb

## 2020-04-14 DIAGNOSIS — R229 Localized swelling, mass and lump, unspecified: Secondary | ICD-10-CM

## 2020-04-14 NOTE — Patient Instructions (Signed)
Keep elevated, monitor for worsening swelling, call immediately if any redness, heat, worsening tenderness, etc    Insect Bite, Adult An insect bite can make your skin red, itchy, and swollen. An insect bite is different from an insect sting, which happens when an insect injects poison (venom) into the skin. Some insects can spread disease to people through a bite. However, most insect bites do not lead to disease and are not serious. What are the causes? Insects may bite for a variety of reasons, including:  Hunger.  To defend themselves. Insects that bite include:  Spiders.  Mosquitoes.  Ticks.  Fleas.  Ants.  Flies.  Kissing bugs.  Chiggers. What are the signs or symptoms? Symptoms of this condition include:  Itching or pain in the bite area.  Redness and swelling in the bite area.  An open wound (skin ulcer). In many cases, symptoms last for 2-4 days. In rare cases, a person may have a severe allergic reaction (anaphylactic reaction) to a bite. Symptoms of an anaphylactic reaction may include:  Feeling warm in the face (flushed). This may include redness.  Itchy, red, swollen areas of skin (hives).  Swelling of the eyes, lips, face, mouth, tongue, or throat.  Difficulty breathing, speaking, or swallowing.  Noisy breathing (wheezing).  Dizziness or light-headedness.  Fainting.  Pain or cramping in the abdomen.  Vomiting.  Diarrhea. How is this diagnosed? This condition is usually diagnosed based on symptoms and a physical exam. How is this treated? Treatment is usually not needed. Symptoms often go away on their own. When treatment is recommended, it may involve:  Applying a cream or lotion to the bite area. This treatment helps with itching.  Taking an antibiotic medicine. This treatment is needed if the bite area gets infected.  Getting a tetanus shot, if you are not up to date on this vaccine.  Applying ice to the affected area.  Allergy  medicines called antihistamines. This treatment may be needed if you develop itching or an allergic reaction to the insect bite.  Giving yourself an epinephrine injection if you have an anaphylactic reaction to a bite. To give the injection, you will use what is commonly called an auto-injector "pen" (pre-filled automatic epinephrine injection device). Your health care provider will teach you how to use an auto-injector pen. Follow these instructions at home: Bite area care   Do not scratch the bite area.  Keep the bite area clean and dry. Wash it every day with soap and water as told by your health care provider.  Check the bite area every day for signs of infection. Check for: ? Redness, swelling, or pain. ? Fluid or blood. ? Warmth. ? Pus or a bad smell. Managing pain, itching, and swelling   You may apply cortisone cream, calamine lotion, or a paste made of baking soda and water to the bite area as told by your health care provider.  If directed, put ice on the bite area. ? Put ice in a plastic bag. ? Place a towel between your skin and the bag. ? Leave the ice on for 20 minutes, 2-3 times a day. General instructions  Apply or take over-the-counter and prescription medicines only as told by your health care provider.  If you were prescribed an antibiotic medicine, take or apply it as told by your health care provider. Do not stop using the antibiotic even if your condition improves.  Keep all follow-up visits as told by your health care provider. This is  important. How is this prevented? To help reduce your risk of insect bites:  When you are outdoors, wear clothing that covers your arms and legs. This is especially important in the early morning and evening.  Use insect repellent. The best insect repellents contain DEET, picaridin, oil of lemon eucalyptus (OLE), or IR3535.  Consider spraying your clothing with a pesticide called permethrin. Permethrin helps prevent insect  bites. It works for several weeks and for up to 5-6 clothing washes. Do not apply permethrin directly to the skin.  If your home windows do not have screens, consider installing them.  If you will be sleeping in an area where there are mosquitoes, consider covering your sleeping area with a mosquito net. Contact a health care provider if:  You have redness, swelling, or pain in the bite area.  You have fluid or blood coming from the bite area.  The bite area feels warm to the touch.  You have pus or a bad smell coming from the bite area.  You have a fever. Get help right away if:  You have joint pain.  You have a rash.  You feel unusually tired or sleepy.  You have neck pain.  You have a headache.  You have unusual weakness.  You develop symptoms of an anaphylactic reaction. These may include: ? Flushed skin. ? Hives. ? Swelling of the eyes, lips, face, mouth, tongue, or throat. ? Difficulty breathing, speaking, or swallowing. ? Wheezing. ? Dizziness or light-headedness. ? Fainting. ? Pain or cramping in the abdomen. ? Vomiting. ? Diarrhea. These symptoms may represent a serious problem that is an emergency. Do not wait to see if the symptoms will go away. Do the following right away:  Use the auto-injector pen as you have been instructed.  Get medical help. Call your local emergency services (911 in the [place] Do not drive yourself to the hospital. Summary  An insect bite can make your skin red, itchy, and swollen.  Treatment is usually not needed. Symptoms often go away on their own. When treatment is recommended, it may involve taking medicine, applying medicine to the area, or applying ice.  Apply or take over-the-counter and prescription medicines only as told by your health care provider.  Use insect repellent to help prevent insect bites.  Contact a health care provider if you have any signs of infection in the bite area. This information is not  intended to replace advice given to you by your health care provider. Make sure you discuss any questions you have with your health care provider. Document Revised: 05/26/2018 Document Reviewed: 05/26/2018 Elsevier Patient Education  [employer].

## 2020-04-14 NOTE — Progress Notes (Signed)
Assessment and Plan:  Tracy Barrett was seen today for leg problem.  Diagnoses and all orders for this visit:  Localized soft tissue swelling ? Possible insect bite 2 days ago, has improved significantly and nearly resolved at time of evaluation, minimal soft tissue swelling without heat/erythema/notable tenderness or other concerning factors, no joint involvement per exam.  Continue with RICE; may consider adding antiinflammatories No other intervention felt to be needed at this time, appropriate to monitor Follow up immediately if swelling becoming worse despite interventions, or with any local heat, erythema or discomfort in knee joint, consider imaging and abx if indicated He will follow up this Friday for routine OV and recheck at that time  Further disposition pending results of labs. Discussed med's effects and SE's.   Over 15 minutes of exam, counseling, chart review, and critical decision making was performed.   Future Appointments  Date Time Provider Department Center  04/18/2020  9:00 AM [name], NP [facility] None  07/25/2020 10:30 AM [name], NP [facility] None  01/19/2021  9:00 AM [name], NP [facility] None    ------------------------------------------------------------------------------------------------------------------   HPI BP 126/80   Pulse 66   Temp (!) 97.5 F (36.4 C)   Wt 192 lb (87.1 kg)   SpO2 96%   BMI 29.85 kg/m   44 y.o.male presents for evaluation of swollen area to leg x 1 day.   Reports he woke up yesterday and noted R upper shin had area with significant swelling "side of calf was swollen as big as my thigh." He was under his trailer the day prior, he wonders if he was bitten by a spider. Called out of work yesterday, elevated leg all day yesterday and this AM, has improved significantly staying off of it in this time, currently with only very limited swelling, area approx 2 inches of soft tissue swelling. Had some local soreness  which has improved with resolution of swelling. No localized erythema or significant warmth. No rash or lesion.   Denies fever/chills, HA, dizziness, nausea/vomiting, palpitations, dyspnea, cough, URI sx, GI sx, myalgias, arthralgias.   Past Medical History:  Diagnosis Date  . 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 Generalized anxiety disorder 02/27/2015  . Generalized anxiety disorder 02/27/2015  . Herpes simplex type 1 antibody positive   . Hyperlipidemia   . Prediabetes 12/29/2017  . Traumatic leg injury 11/28/2013  . Vitamin D deficiency      Allergies  Allergen Reactions  . Celexa [Citalopram Hydrobromide]     No relief  . Pravastatin     Myalgias  . Wellbutrin [Bupropion]     No relief  . Zoloft [Sertraline Hcl]     aggitation    Current Outpatient Medications on File Prior to Visit  Medication Sig  . rosuvastatin (CRESTOR) 5 MG tablet START BY TAKING 1 TAB ONCE A WEEK FOR 2 WEEKS IF TOLERATING, INCREASE TO 2 TABS/WEEK INCREASE GRADUALLY AS TOLERATED TO TAKING EVERY OTHER DAY.   No current facility-administered medications on file prior to visit.    ROS: all negative except above.   Physical Exam:  BP 126/80   Pulse 66   Temp (!) 97.5 F (36.4 C)   Wt 192 lb (87.1 kg)   SpO2 96%   BMI 29.85 kg/m   General Appearance: Well nourished, in no apparent distress. Eyes: PERRLA, EOMs, conjunctiva no swelling or erythema Sinuses: No Frontal/maxillary tenderness ENT/Mouth: Ext aud canals clear, TMs without erythema, bulging. No erythema, swelling, or exudate on post pharynx.  Tonsils not swollen or erythematous. Hearing normal.  Neck: Supple, thyroid normal.  Respiratory: Respiratory effort normal, BS equal bilaterally without rales, rhonchi, wheezing or stridor.  Cardio: RRR with no MRGs. Brisk peripheral pulses without edema.  Abdomen: Soft, + BS.  Non tender, no guarding. Lymphatics: Non tender without lymphadenopathy.  Musculoskeletal: Full ROM, 5/5  strength, normal gait. Scant 1.5-2 inch area of soft tissue swelling without erythema, warmth, appearance of lesion inferolateral to tibial tuberosity.  Skin: Warm, dry without rashes, lesions, ecchymosis.  Neuro: Cranial nerves intact. Normal muscle tone, no cerebellar symptoms. Sensation intact.  Psych: Awake and oriented X 3, normal affect, Insight and Judgment appropriate.     Tracy Ribas, NP 4:33 PM [facility]

## 2020-04-17 NOTE — Progress Notes (Signed)
FOLLOW UP  Assessment and Plan:   Cholesterol Currently above goal; reports has been compliant with rosuvastatin 5 mg daily  LDL goal <100 Continue low cholesterol diet and exercise.  Check lipid panel.   Other abnormal glucose Continue diet and exercise.  Perform daily foot/skin check, notify office of any concerning changes.  Check A1C at CPE; last was normal; monitor serum glucose, weight   Overweight Long discussion about weight loss, diet, and exercise Recommended diet heavy in fruits and veggies and low in animal meats, cheeses, and dairy products, appropriate calorie intake Discussed ideal weight for height  Patient will work on cutting down on meat intake, portions, highly processed foods Will follow up in 3 months  Vitamin D Def Below goal at last visit; he has not initiated supplementation Encouraged supplementation for goal of 60-100 Defer Vit D level to CPE  Anxiety Off medications and reportedly doing well  Stress management techniques discussed, increase water, good sleep hygiene discussed, increase exercise, and increase veggies.   Tobacco use Discussed risks associated with tobacco use and advised to reduce or quit Patient is not ready to do so, but advised to consider strongly; resources provided on AVS, declines chantix or other medication Will follow up at the next visit  Binge drinking Patient reports not longer binge drinking; off of naltrexone and reports continues to do well   Localized soft tissue swelling R shin, doesn't appear involved with joint, unexplained, ? Insect bite vs hematoma vs other soft tissue swelling Exam is otherwise without concerning features, no signs if infection Overall slowly improving per patient Discussed [place] if getting worse, will try prednisone taper for swellling   Continue diet and meds as discussed. Further disposition pending results of labs. Discussed med's effects and SE's.   Over 30 minutes of exam, counseling,  chart review, and critical decision making was performed.   Future Appointments  Date Time Provider [place]  07/25/2020 10:30 AM Liane Comber, NP [facility] None  01/19/2021  9:00 AM Liane Comber, NP [facility] None    ----------------------------------------------------------------------------------------------------------------------  [name], married with 4 children, presents for 6 month follow up on cholesterol, glucose management, weight, smoking/bringe drinking, anxiety and vitamin D deficiency.    He was seen earlier this week for unexplained R shin soft tissue swelling, ? Insect bite vs soft tissue injury working   He has hx of alcohol binge- drinking and earlier this year was admitted to inpatient 4 week intensive treatment at [facility] in [place]. He reports he is doing very well after this, strongly recommends program, was doing AA nearly daily but none since the pandemic, has been sober since 12/04/2018. He was on naltrexone 50 mg but has since tapered off and reports he continues to do well. "Not a problem any more." He does still communicate with people he met through [employer] and [facility].   Had anxiety, didn't tolerate medications well but denies problems since quitting alcohol.   he currently continues to smoke 0.5 pack a day; discussed risks associated with smoking, patient is not ready to quit at this time, but is interested in doing so in the future with awareness of risks.   BMI is Body mass index is 29.95 kg/m., he has not been working on diet and exercise; he reports he is active during the day and rarely sits, works a physically intense job.  Wt Readings from Last 3 Encounters:  04/18/20 197 lb (89.4 kg)  04/14/20 192 lb (87.1 kg)  01/18/20 198 lb 9.6 oz (90.1 kg)   Today their BP is BP: 110/78  He does not workout. He denies chest pain, shortness of breath, dizziness.   He is on cholesterol medication - prescribed rosuvastatin 5  mg daily. He denies having myalgias on medication. His cholesterol is not at goal. The cholesterol last visit was:   Lab Results  Component Value Date   CHOL 241 (H) 01/18/2020   HDL 35 (L) 01/18/2020   LDLCALC 173 (H) 01/18/2020   TRIG 173 (H) 01/18/2020   CHOLHDL 6.9 (H) 01/18/2020    He has not been working on diet and exercise for glucose management, and denies hyperglycemia, increased appetite, nausea, paresthesia of the feet, polydipsia and polyuria. Last A1C in the office was:  Lab Results  Component Value Date   HGBA1C 5.7 (H) 01/18/2020   Patient is not on Vitamin D supplement, reports has had bone aching and upset stomach with supplement with multiple attempts.  Lab Results  Component Value Date   VD25OH 19 (L) 01/11/2019       Current Medications:  Current Outpatient Medications on File Prior to Visit  Medication Sig  . rosuvastatin (CRESTOR) 5 MG tablet START BY TAKING 1 TAB ONCE A WEEK FOR 2 WEEKS IF TOLERATING, INCREASE TO 2 TABS/WEEK INCREASE GRADUALLY AS TOLERATED TO TAKING EVERY OTHER DAY. (Patient taking differently: Takes one tablet daily)   No current facility-administered medications on file prior to visit.     Allergies:  Allergies  Allergen Reactions  . Celexa [Citalopram Hydrobromide]     No relief  . Pravastatin     Myalgias  . Wellbutrin [Bupropion]     No relief  . Zoloft [Sertraline Hcl]     aggitation     Medical History:  Past Medical History:  Diagnosis Date  . 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 Generalized anxiety disorder 02/27/2015  . Generalized anxiety disorder 02/27/2015  . Herpes simplex type 1 antibody positive   . Hyperlipidemia   . Prediabetes 12/29/2017  . Traumatic leg injury 11/28/2013  . Vitamin D deficiency    Family history- Reviewed and unchanged Social history- Reviewed and unchanged   Review of Systems:  Review of Systems  Constitutional: Negative for malaise/fatigue and weight loss.  HENT:  Negative for hearing loss and tinnitus.   Eyes: Negative for blurred vision and double vision.  Respiratory: Negative for cough, shortness of breath and wheezing.   Cardiovascular: Negative for chest pain, palpitations, orthopnea, claudication and leg swelling.  Gastrointestinal: Negative for abdominal pain, blood in stool, constipation, diarrhea, heartburn, melena, nausea and vomiting.  Genitourinary: Negative.   Musculoskeletal: Negative for joint pain and myalgias.  Skin: Negative for rash.  Neurological: Negative for dizziness, tingling, sensory change, weakness and headaches.  Endo/Heme/Allergies: Negative for polydipsia.  Psychiatric/Behavioral: Negative for depression, hallucinations, substance abuse and suicidal ideas. The patient is not nervous/anxious and does not have insomnia.   All other systems reviewed and are negative.    Physical Exam: BP 110/78   Pulse (!) 57   Temp (!) 97.5 F (36.4 C)   Ht 5' 8"  (1.727 m)   Wt 197 lb (89.4 kg)   SpO2 98%   BMI 29.95 kg/m  Wt Readings from Last 3 Encounters:  04/18/20 197 lb (89.4 kg)  04/14/20 192 lb (87.1 kg)  01/18/20 198 lb 9.6 oz (90.1 kg)   General Appearance: Well nourished, in no apparent distress. Eyes: PERRLA, EOMs, conjunctiva no swelling or erythema Sinuses: No  Frontal/maxillary tenderness ENT/Mouth: Ext aud canals clear, TMs without erythema, bulging. No erythema, swelling, or exudate on post pharynx.  Tonsils not swollen or erythematous. Hearing normal.  Neck: Supple, thyroid normal.  Respiratory: Respiratory effort normal, BS equal bilaterally without rales, rhonchi, wheezing or stridor.  Cardio: RRR with no MRGs. Brisk peripheral pulses without edema.  Abdomen: Soft, + BS.  Non tender, no guarding, rebound, hernias, masses. Lymphatics: Non tender without lymphadenopathy.  Musculoskeletal: Full ROM, 5/5 strength, Normal gait. R anterior shin has approx 2 cm area of soft swelling, without erythema, heat,  palpable bony abnormality. Not significantly tender. Some yellowing ecchymosis settling in lower leg.  Skin: Warm, dry without rashes, lesions, ecchymosis.  Neuro: Cranial nerves intact. No cerebellar symptoms.  Psych: Awake and oriented X 3, normal affect, Insight and Judgment appropriate.    Izora Ribas, NP 9:11 AM [facility]

## 2020-04-18 ENCOUNTER — Ambulatory Visit: Payer: 59 | Admitting: Adult Health

## 2020-04-18 ENCOUNTER — Other Ambulatory Visit: Payer: Self-pay

## 2020-04-18 ENCOUNTER — Encounter: Payer: Self-pay | Admitting: Adult Health

## 2020-04-18 VITALS — BP 110/78 | HR 57 | Temp 97.5°F | Ht 68.0 in | Wt 197.0 lb

## 2020-04-18 DIAGNOSIS — E663 Overweight: Secondary | ICD-10-CM | POA: Diagnosis not present

## 2020-04-18 DIAGNOSIS — F17200 Nicotine dependence, unspecified, uncomplicated: Secondary | ICD-10-CM

## 2020-04-18 DIAGNOSIS — F1021 Alcohol dependence, in remission: Secondary | ICD-10-CM

## 2020-04-18 DIAGNOSIS — E559 Vitamin D deficiency, unspecified: Secondary | ICD-10-CM

## 2020-04-18 DIAGNOSIS — R7309 Other abnormal glucose: Secondary | ICD-10-CM

## 2020-04-18 DIAGNOSIS — R229 Localized swelling, mass and lump, unspecified: Secondary | ICD-10-CM

## 2020-04-18 DIAGNOSIS — E782 Mixed hyperlipidemia: Secondary | ICD-10-CM | POA: Diagnosis not present

## 2020-04-18 MED ORDER — PREDNISONE 20 MG PO TABS: ORAL_TABLET | ORAL | 0 refills | Status: DC

## 2020-04-18 NOTE — Patient Instructions (Signed)
Goals    . LDL CALC < 100    . Reduce caffeine intake     Cut back to 2-3 cups of coffee per day    . Reduce/quit smoking    . Weight (lb) < 185 lb (83.9 kg)       Try wrapping area of swelling with ACE wrap (start lower on let and wrap up) or compression sock, ice and elevation in the evening. Try prednisone taper.   Please monitor for any concerning changes - hot, red, worsening pain, discomfort up in joint and let me know  If getting any worse will get an ultrasound -    High-Fiber Diet Fiber, also called dietary fiber, is a type of carbohydrate that is found in fruits, vegetables, whole grains, and beans. A high-fiber diet can have many health benefits. Your health care provider may recommend a high-fiber diet to help:  Prevent constipation. Fiber can make your bowel movements more regular.  Lower your cholesterol.  Relieve the following conditions: ? Swelling of veins in the anus (hemorrhoids). ? Swelling and irritation (inflammation) of specific areas of the digestive tract (uncomplicated diverticulosis). ? A problem of the large intestine (colon) that sometimes causes pain and diarrhea (irritable bowel syndrome, IBS).  Prevent overeating as part of a weight-loss plan.  Prevent heart disease, type 2 diabetes, and certain cancers. What is my plan? The recommended daily fiber intake in grams (g) includes:  38 g for men age 49 or younger.  30 g for men over age 26.  25 g for women age 49 or younger.  21 g for women over age 9. You can get the recommended daily intake of dietary fiber by:  Eating a variety of fruits, vegetables, grains, and beans.  Taking a fiber supplement, if it is not possible to get enough fiber through your diet. What do I need to know about a high-fiber diet?  It is better to get fiber through food sources rather than from fiber supplements. There is not a lot of research about how effective supplements are.  Always check the fiber content  on the nutrition facts label of any prepackaged food. Look for foods that contain 5 g of fiber or more per serving.  Talk with a diet and nutrition specialist (dietitian) if you have questions about specific foods that are recommended or not recommended for your medical condition, especially if those foods are not listed below.  Gradually increase how much fiber you consume. If you increase your intake of dietary fiber too quickly, you may have bloating, cramping, or gas.  Drink plenty of water. Water helps you to digest fiber. What are tips for following this plan?  Eat a wide variety of high-fiber foods.  Make sure that half of the grains that you eat each day are whole grains.  Eat breads and cereals that are made with whole-grain flour instead of refined flour or white flour.  Eat brown rice, bulgur wheat, or millet instead of white rice.  Start the day with a breakfast that is high in fiber, such as a cereal that contains 5 g of fiber or more per serving.  Use beans in place of meat in soups, salads, and pasta dishes.  Eat high-fiber snacks, such as berries, raw vegetables, nuts, and popcorn.  Choose whole fruits and vegetables instead of processed forms like juice or sauce. What foods can I eat?  Fruits Berries. Pears. Apples. Oranges. Avocado. Prunes and raisins. Dried figs. Vegetables Sweet potatoes.  Spinach. Kale. Artichokes. Cabbage. Broccoli. Cauliflower. Green peas. Carrots. Squash. Grains Whole-grain breads. Multigrain cereal. Oats and oatmeal. Brown rice. Barley. Bulgur wheat. [place]. Quinoa. Bran muffins. Popcorn. Rye wafer crackers. Meats and other proteins [place], kidney, and pinto beans. Soybeans. Split peas. Lentils. Nuts and seeds. Dairy Fiber-fortified yogurt. Beverages Fiber-fortified soy milk. Fiber-fortified orange juice. Other foods Fiber bars. The items listed above may not be a complete list of recommended foods and beverages. Contact a dietitian for  more options. What foods are not recommended? Fruits Fruit juice. Cooked, strained fruit. Vegetables Fried potatoes. Canned vegetables. Well-cooked vegetables. Grains White bread. Pasta made with refined flour. White rice. Meats and other proteins Fatty cuts of meat. Fried chicken or fried fish. Dairy Milk. Yogurt. Cream cheese. Sour cream. Fats and oils Butters. Beverages Soft drinks. Other foods Cakes and pastries. The items listed above may not be a complete list of foods and beverages to avoid. Contact a dietitian for more information. Summary  Fiber is a type of carbohydrate. It is found in fruits, vegetables, whole grains, and beans.  There are many health benefits of eating a high-fiber diet, such as preventing constipation, lowering blood cholesterol, helping with weight loss, and reducing your risk of heart disease, diabetes, and certain cancers.  Gradually increase your intake of fiber. Increasing too fast can result in cramping, bloating, and gas. Drink plenty of water while you increase your fiber.  The best sources of fiber include whole fruits and vegetables, whole grains, nuts, seeds, and beans. This information is not intended to replace advice given to you by your health care provider. Make sure you discuss any questions you have with your health care provider. Document Revised: 09/19/2017 Document Reviewed: 09/19/2017 Elsevier Patient Education  2020 [place]  [phone] for more information or for a free program for smoking cessation help.   You can call QUIT SMART 1-800-QUIT-NOW for free nicotine patches or replacement therapy- if they are out- keep calling  [facility] Can call for smoking cessation classes, [phone]  If you have a smart phone, please look up Smoke Free app, this will help you stay on track and give you information about money you have saved, life that you have  gained back and a ton of more information.     ADVANTAGES OF QUITTING SMOKING  Within 20 minutes, blood pressure decreases. Your pulse is at normal level.  After 8 hours, carbon monoxide levels in the blood return to normal. Your oxygen level increases.  After 24 hours, the chance of having a heart attack starts to decrease. Your breath, hair, and body stop smelling like smoke.  After 48 hours, damaged nerve endings begin to recover. Your sense of taste and smell improve.  After 72 hours, the body is virtually free of nicotine. Your bronchial tubes relax and breathing becomes easier.  After 2 to 12 weeks, lungs can hold more air. Exercise becomes easier and circulation improves.  After 1 year, the risk of coronary heart disease is cut in half.  After 5 years, the risk of stroke falls to the same as a nonsmoker.  After 10 years, the risk of lung cancer is cut in half and the risk of other cancers decreases significantly.  After 15 years, the risk of coronary heart disease drops, usually to the level of a nonsmoker.  You will have extra money to spend on things other than cigarettes.

## 2020-04-19 LAB — CBC WITH DIFFERENTIAL/PLATELET
Absolute Monocytes: 1069 cells/uL — ABNORMAL HIGH (ref 200–950)
Basophils Absolute: 32 cells/uL (ref 0–200)
Basophils Relative: 0.3 %
Eosinophils Absolute: 205 cells/uL (ref 15–500)
Eosinophils Relative: 1.9 %
HCT: 44.3 % (ref 38.5–50.0)
Hemoglobin: 15 g/dL (ref 13.2–17.1)
Lymphs Abs: 2959 cells/uL (ref 850–3900)
MCH: 31.6 pg (ref 27.0–33.0)
MCHC: 33.9 g/dL (ref 32.0–36.0)
MCV: 93.5 fL (ref 80.0–100.0)
MPV: 11.4 fL (ref 7.5–12.5)
Monocytes Relative: 9.9 %
Neutro Abs: 6534 cells/uL (ref 1500–7800)
Neutrophils Relative %: 60.5 %
Platelets: 210 10*3/uL (ref 140–400)
RBC: 4.74 10*6/uL (ref 4.20–5.80)
RDW: 12.2 % (ref 11.0–15.0)
Total Lymphocyte: 27.4 %
WBC: 10.8 10*3/uL (ref 3.8–10.8)

## 2020-04-19 LAB — LIPID PANEL
Cholesterol: 162 mg/dL (ref <200)
HDL: 41 mg/dL (ref >=40)
LDL Cholesterol (Calc): 100 mg/dL (calc) — ABNORMAL HIGH
Non-HDL Cholesterol (Calc): 121 mg/dL (calc) (ref <130)
Total CHOL/HDL Ratio: 4 (calc) (ref <5.0)
Triglycerides: 118 mg/dL (ref <150)

## 2020-04-19 LAB — COMPLETE METABOLIC PANEL WITH GFR
AG Ratio: 1.9 (calc) (ref 1.0–2.5)
ALT: 20 U/L (ref 9–46)
AST: 18 U/L (ref 10–40)
Albumin: 4.5 g/dL (ref 3.6–5.1)
Alkaline phosphatase (APISO): 75 U/L (ref 36–130)
BUN: 16 mg/dL (ref 7–25)
CO2: 29 mmol/L (ref 20–32)
Calcium: 9.7 mg/dL (ref 8.6–10.3)
Chloride: 104 mmol/L (ref 98–110)
Creat: 0.87 mg/dL (ref 0.60–1.35)
GFR, Est African American: 122 mL/min/{1.73_m2} (ref >=60)
GFR, Est Non African American: 105 mL/min/{1.73_m2} (ref >=60)
Globulin: 2.4 g/dL (calc) (ref 1.9–3.7)
Glucose, Bld: 80 mg/dL (ref 65–99)
Potassium: 4.6 mmol/L (ref 3.5–5.3)
Sodium: 139 mmol/L (ref 135–146)
Total Bilirubin: 0.5 mg/dL (ref 0.2–1.2)
Total Protein: 6.9 g/dL (ref 6.1–8.1)

## 2020-04-19 LAB — TSH: TSH: 3.69 mIU/L (ref 0.40–4.50)

## 2020-07-24 NOTE — Progress Notes (Signed)
FOLLOW UP  Assessment and Plan:   Cholesterol Currently above goal; ran out of med and insurance lapsed, was expensive so didn't restart Reviewed GoodRx resources; resent to restart  LDL goal <100 Continue low cholesterol diet and exercise.  Check lipid panel.   Other abnormal glucose Continue diet and exercise.  Perform daily foot/skin check, notify office of any concerning changes.  Check A1C at CPE; last was normal; monitor serum glucose, weight   Overweight Long discussion about weight loss, diet, and exercise Recommended diet heavy in fruits and veggies and low in animal meats, cheeses, and dairy products, appropriate calorie intake Discussed ideal weight for height  Patient will work on cutting down on meat intake, portions, highly processed foods Will follow up in 3 months  Vitamin D Def Below goal at last visit; he has not initiated supplementation Encouraged supplementation for goal of 60-100 Defer Vit D level to CPE  Tobacco use Discussed risks associated with tobacco use and advised to reduce or quit Patient is ready to do so; discussed at length, resources provided on AVS, declines chantix or other medication Will follow up at the next visit  Binge drinking Patient reports not longer binge drinking; off of naltrexone and reports continues to do well    Continue diet and meds as discussed. Further disposition pending results of labs. Discussed med's effects and SE's.   Over 30 minutes of exam, counseling, chart review, and critical decision making was performed.   Future Appointments  Date Time Provider [place]  01/19/2021  9:00 AM Liane Comber, NP [facility] None    ----------------------------------------------------------------------------------------------------------------------  [name], married with 4 children, presents for 6 month follow up on cholesterol, glucose management, weight, smoking/bringe drinking, anxiety and vitamin  D deficiency.   He has hx of alcohol binge- drinking. In Jan 2020 was admitted to inpatient 4 week intensive treatment at [facility] in [place]. He has done well since this, tapered off of naltrexone, was doing AA until pandemic. Reports he continues to do well. "Not a problem any more."  He does still communicate with people he met through [employer] and [employer].  Anxiety resolved since quitting alcohol.   he currently continues to smoke 0.5 pack a day; discussed risks associated with smoking, patient is ready to quit, plans to start meditating.   BMI is Body mass index is 29.19 kg/m., he has not been working on diet and exercise; recently retired from physically intense job, has been playing ball with kids, wants to start hiking, generally active Wt Readings from Last 3 Encounters:  07/25/20 192 lb (87.1 kg)  04/18/20 197 lb (89.4 kg)  04/14/20 192 lb (87.1 kg)   Today their BP is BP: 122/82  He does not workout. He denies chest pain, shortness of breath, dizziness.   He is on cholesterol medication - prescribed rosuvastatin 5 mg daily, but was taking every 2-3 days due to myalgias. HE admits ran out of med, insurance was lapsed, hasn't picked up since getting back insurance. The cholesterol last visit was:   Lab Results  Component Value Date   CHOL 162 04/18/2020   HDL 41 04/18/2020   LDLCALC 100 (H) 04/18/2020   TRIG 118 04/18/2020   CHOLHDL 4.0 04/18/2020    He has not been working on diet and exercise for glucose management, and denies hyperglycemia, increased appetite, nausea, paresthesia of the feet, polydipsia and polyuria. Last A1C in the office was:  Lab Results  Component Value Date  HGBA1C 5.7 (H) 01/18/2020   Patient is not on Vitamin D supplement, reports has had bone aching and upset stomach with supplement with multiple attempts.  Lab Results  Component Value Date   VD25OH 19 (L) 01/11/2019       Current Medications:  Current Outpatient Medications on  File Prior to Visit  Medication Sig  . predniSONE (DELTASONE) 20 MG tablet 2 tablets daily for 3 days, 1 tablet daily for 4 days.  . rosuvastatin (CRESTOR) 5 MG tablet START BY TAKING 1 TAB ONCE A WEEK FOR 2 WEEKS IF TOLERATING, INCREASE TO 2 TABS/WEEK INCREASE GRADUALLY AS TOLERATED TO TAKING EVERY OTHER DAY. (Patient not taking: Reported on 07/25/2020)   No current facility-administered medications on file prior to visit.     Allergies:  Allergies  Allergen Reactions  . Celexa [Citalopram Hydrobromide]     No relief  . Pravastatin     Myalgias  . Wellbutrin [Bupropion]     No relief  . Zoloft [Sertraline Hcl]     aggitation     Medical History:  Past Medical History:  Diagnosis Date  . 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 Generalized anxiety disorder 02/27/2015  . Generalized anxiety disorder 02/27/2015  . Herpes simplex type 1 antibody positive   . Hyperlipidemia   . Prediabetes 12/29/2017  . Traumatic leg injury 11/28/2013  . Vitamin D deficiency    Family history- Reviewed and unchanged Social history- Reviewed and unchanged   Review of Systems:  Review of Systems  Constitutional: Negative for malaise/fatigue and weight loss.  HENT: Negative for hearing loss and tinnitus.   Eyes: Negative for blurred vision and double vision.  Respiratory: Negative for cough, shortness of breath and wheezing.   Cardiovascular: Negative for chest pain, palpitations, orthopnea, claudication and leg swelling.  Gastrointestinal: Negative for abdominal pain, blood in stool, constipation, diarrhea, heartburn, melena, nausea and vomiting.  Genitourinary: Negative.   Musculoskeletal: Negative for joint pain and myalgias.  Skin: Negative for rash.  Neurological: Negative for dizziness, tingling, sensory change, weakness and headaches.  Endo/Heme/Allergies: Negative for polydipsia.  Psychiatric/Behavioral: Negative for depression, hallucinations, substance abuse and suicidal  ideas. The patient is not nervous/anxious and does not have insomnia.   All other systems reviewed and are negative.    Physical Exam: BP 122/82   Pulse 69   Temp (!) 97.5 F (36.4 C)   Wt 192 lb (87.1 kg)   SpO2 97%   BMI 29.19 kg/m  Wt Readings from Last 3 Encounters:  07/25/20 192 lb (87.1 kg)  04/18/20 197 lb (89.4 kg)  04/14/20 192 lb (87.1 kg)   General Appearance: Well nourished, in no apparent distress. Eyes: PERRLA, EOMs, conjunctiva no swelling or erythema Sinuses: No Frontal/maxillary tenderness ENT/Mouth: Ext aud canals clear, TMs without erythema, bulging. No erythema, swelling, or exudate on post pharynx.  Tonsils not swollen or erythematous. Hearing normal.  Neck: Supple, thyroid normal.  Respiratory: Respiratory effort normal, BS equal bilaterally without rales, rhonchi, wheezing or stridor.  Cardio: RRR with no MRGs. Brisk peripheral pulses without edema.  Abdomen: Soft, + BS.  Non tender, no guarding, rebound, hernias, masses. Lymphatics: Non tender without lymphadenopathy.  Musculoskeletal: Full ROM, 5/5 strength, Normal gait. Skin: Warm, dry without rashes, lesions, ecchymosis.  Neuro: Cranial nerves intact. No cerebellar symptoms.  Psych: Awake and oriented X 3, normal affect, Insight and Judgment appropriate.    Izora Ribas, NP 10:41 AM [facility]

## 2020-07-25 ENCOUNTER — Encounter: Payer: Self-pay | Admitting: Adult Health

## 2020-07-25 ENCOUNTER — Ambulatory Visit: Payer: 59 | Admitting: Adult Health

## 2020-07-25 ENCOUNTER — Other Ambulatory Visit: Payer: Self-pay

## 2020-07-25 VITALS — BP 122/82 | HR 69 | Temp 97.5°F | Wt 192.0 lb

## 2020-07-25 DIAGNOSIS — E559 Vitamin D deficiency, unspecified: Secondary | ICD-10-CM

## 2020-07-25 DIAGNOSIS — F1021 Alcohol dependence, in remission: Secondary | ICD-10-CM

## 2020-07-25 DIAGNOSIS — Z79899 Other long term (current) drug therapy: Secondary | ICD-10-CM

## 2020-07-25 DIAGNOSIS — F17200 Nicotine dependence, unspecified, uncomplicated: Secondary | ICD-10-CM | POA: Diagnosis not present

## 2020-07-25 DIAGNOSIS — E782 Mixed hyperlipidemia: Secondary | ICD-10-CM | POA: Diagnosis not present

## 2020-07-25 DIAGNOSIS — E663 Overweight: Secondary | ICD-10-CM

## 2020-07-25 DIAGNOSIS — R7309 Other abnormal glucose: Secondary | ICD-10-CM | POA: Diagnosis not present

## 2020-07-25 MED ORDER — ROSUVASTATIN CALCIUM 5 MG PO TABS: ORAL_TABLET | ORAL | 1 refills | Status: DC

## 2020-07-25 NOTE — Patient Instructions (Addendum)
Goals    . LDL CALC < 100    . Reduce caffeine intake     Cut back to 2-3 cups of coffee per day    . Reduce/quit smoking    . Weight (lb) < 185 lb (83.9 kg)      When you get stressed and want a cigarette, remember to try deep breathing exercises or meditation   Deep breath in through your nose, and slow exhale through your mouth over 7 seconds. Imagine all the muscles in your body relaxing as you exhale. Repeat a few times.   Restart cholesterol medication - try every 3rd day for a few weeks, then see if you can tolerate taking MWF  Try high fiber diet - this is good for prediabetes, cholesterol, and also reduces heart attack/strokes/cancers of many kinds     SMOKING CESSATION  [employer] society  [phone] for more information or for a free program for smoking cessation help.   You can call QUIT SMART 1-800-QUIT-NOW for free nicotine patches or replacement therapy- if they are out- keep calling  [facility] Can call for smoking cessation classes, [phone]  If you have a smart phone, please look up Smoke Free app, this will help you stay on track and give you information about money you have saved, life that you have gained back and a ton of more information.     ADVANTAGES OF QUITTING SMOKING  Within 20 minutes, blood pressure decreases. Your pulse is at normal level.  After 8 hours, carbon monoxide levels in the blood return to normal. Your oxygen level increases.  After 24 hours, the chance of having a heart attack starts to decrease. Your breath, hair, and body stop smelling like smoke.  After 48 hours, damaged nerve endings begin to recover. Your sense of taste and smell improve.  After 72 hours, the body is virtually free of nicotine. Your bronchial tubes relax and breathing becomes easier.  After 2 to 12 weeks, lungs can hold more air. Exercise becomes easier and circulation improves.  After 1 year, the risk of coronary heart  disease is cut in half.  After 5 years, the risk of stroke falls to the same as a nonsmoker.  After 10 years, the risk of lung cancer is cut in half and the risk of other cancers decreases significantly.  After 15 years, the risk of coronary heart disease drops, usually to the level of a nonsmoker.  You will have extra money to spend on things other than cigarettes.      High-Fiber Diet Fiber, also called dietary fiber, is a type of carbohydrate that is found in fruits, vegetables, whole grains, and beans. A high-fiber diet can have many health benefits. Your health care provider may recommend a high-fiber diet to help:  Prevent constipation. Fiber can make your bowel movements more regular.  Lower your cholesterol.  Relieve the following conditions: ? Swelling of veins in the anus (hemorrhoids). ? Swelling and irritation (inflammation) of specific areas of the digestive tract (uncomplicated diverticulosis). ? A problem of the large intestine (colon) that sometimes causes pain and diarrhea (irritable bowel syndrome, IBS).  Prevent overeating as part of a weight-loss plan.  Prevent heart disease, type 2 diabetes, and certain cancers. What is my plan? The recommended daily fiber intake in grams (g) includes:  38 g for men age 99 or younger.  30 g for men over age 89.  25 g for women age 38 or  younger.  21 g for women over age 54. You can get the recommended daily intake of dietary fiber by:  Eating a variety of fruits, vegetables, grains, and beans.  Taking a fiber supplement, if it is not possible to get enough fiber through your diet. What do I need to know about a high-fiber diet?  It is better to get fiber through food sources rather than from fiber supplements. There is not a lot of research about how effective supplements are.  Always check the fiber content on the nutrition facts label of any prepackaged food. Look for foods that contain 5 g of fiber or more per  serving.  Talk with a diet and nutrition specialist (dietitian) if you have questions about specific foods that are recommended or not recommended for your medical condition, especially if those foods are not listed below.  Gradually increase how much fiber you consume. If you increase your intake of dietary fiber too quickly, you may have bloating, cramping, or gas.  Drink plenty of water. Water helps you to digest fiber. What are tips for following this plan?  Eat a wide variety of high-fiber foods.  Make sure that half of the grains that you eat each day are whole grains.  Eat breads and cereals that are made with whole-grain flour instead of refined flour or white flour.  Eat brown rice, bulgur wheat, or millet instead of white rice.  Start the day with a breakfast that is high in fiber, such as a cereal that contains 5 g of fiber or more per serving.  Use beans in place of meat in soups, salads, and pasta dishes.  Eat high-fiber snacks, such as berries, raw vegetables, nuts, and popcorn.  Choose whole fruits and vegetables instead of processed forms like juice or sauce. What foods can I eat?  Fruits Berries. Pears. Apples. Oranges. Avocado. Prunes and raisins. Dried figs. Vegetables Sweet potatoes. Spinach. Kale. Artichokes. Cabbage. Broccoli. Cauliflower. Green peas. Carrots. Squash. Grains Whole-grain breads. Multigrain cereal. Oats and oatmeal. Brown rice. Barley. Bulgur wheat. Millet. Quinoa. Bran muffins. Popcorn. Rye wafer crackers. Meats and other proteins Navy, kidney, and pinto beans. Soybeans. Split peas. Lentils. Nuts and seeds. Dairy Fiber-fortified yogurt. Beverages Fiber-fortified soy milk. Fiber-fortified orange juice. Other foods Fiber bars. The items listed above may not be a complete list of recommended foods and beverages. Contact a dietitian for more options. What foods are not recommended? Fruits Fruit juice. Cooked, strained  fruit. Vegetables Fried potatoes. Canned vegetables. Well-cooked vegetables. Grains White bread. Pasta made with refined flour. White rice. Meats and other proteins Fatty cuts of meat. Fried chicken or fried fish. Dairy Milk. Yogurt. Cream cheese. Sour cream. Fats and oils Butters. Beverages Soft drinks. Other foods Cakes and pastries. The items listed above may not be a complete list of foods and beverages to avoid. Contact a dietitian for more information. Summary  Fiber is a type of carbohydrate. It is found in fruits, vegetables, whole grains, and beans.  There are many health benefits of eating a high-fiber diet, such as preventing constipation, lowering blood cholesterol, helping with weight loss, and reducing your risk of heart disease, diabetes, and certain cancers.  Gradually increase your intake of fiber. Increasing too fast can result in cramping, bloating, and gas. Drink plenty of water while you increase your fiber.  The best sources of fiber include whole fruits and vegetables, whole grains, nuts, seeds, and beans. This information is not intended to replace advice given to you by  your health care provider. Make sure you discuss any questions you have with your health care provider. Document Revised: 09/19/2017 Document Reviewed: 09/19/2017 Elsevier Patient Education  2020 [employer].

## 2020-07-26 LAB — CBC WITH DIFFERENTIAL/PLATELET
Absolute Monocytes: 1147 cells/uL — ABNORMAL HIGH (ref 200–950)
Basophils Absolute: 35 cells/uL (ref 0–200)
Basophils Relative: 0.3 %
Eosinophils Absolute: 152 cells/uL (ref 15–500)
Eosinophils Relative: 1.3 %
HCT: 46.1 % (ref 38.5–50.0)
Hemoglobin: 15.5 g/dL (ref 13.2–17.1)
Lymphs Abs: 3849 cells/uL (ref 850–3900)
MCH: 31.5 pg (ref 27.0–33.0)
MCHC: 33.6 g/dL (ref 32.0–36.0)
MCV: 93.7 fL (ref 80.0–100.0)
MPV: 10.8 fL (ref 7.5–12.5)
Monocytes Relative: 9.8 %
Neutro Abs: 6517 cells/uL (ref 1500–7800)
Neutrophils Relative %: 55.7 %
Platelets: 216 10*3/uL (ref 140–400)
RBC: 4.92 10*6/uL (ref 4.20–5.80)
RDW: 12.1 % (ref 11.0–15.0)
Total Lymphocyte: 32.9 %
WBC: 11.7 10*3/uL — ABNORMAL HIGH (ref 3.8–10.8)

## 2020-07-26 LAB — COMPLETE METABOLIC PANEL WITH GFR
AG Ratio: 2 (calc) (ref 1.0–2.5)
ALT: 12 U/L (ref 9–46)
AST: 13 U/L (ref 10–40)
Albumin: 4.5 g/dL (ref 3.6–5.1)
Alkaline phosphatase (APISO): 71 U/L (ref 36–130)
BUN: 17 mg/dL (ref 7–25)
CO2: 26 mmol/L (ref 20–32)
Calcium: 9.8 mg/dL (ref 8.6–10.3)
Chloride: 104 mmol/L (ref 98–110)
Creat: 0.88 mg/dL (ref 0.60–1.35)
GFR, Est African American: 121 mL/min/{1.73_m2} (ref >=60)
GFR, Est Non African American: 104 mL/min/{1.73_m2} (ref >=60)
Globulin: 2.2 g/dL (calc) (ref 1.9–3.7)
Glucose, Bld: 93 mg/dL (ref 65–99)
Potassium: 4.2 mmol/L (ref 3.5–5.3)
Sodium: 136 mmol/L (ref 135–146)
Total Bilirubin: 0.5 mg/dL (ref 0.2–1.2)
Total Protein: 6.7 g/dL (ref 6.1–8.1)

## 2020-07-26 LAB — TSH: TSH: 2.66 mIU/L (ref 0.40–4.50)

## 2020-07-26 LAB — HEMOGLOBIN A1C
Hgb A1c MFr Bld: 5.6 % of total Hgb (ref <5.7)
Mean Plasma Glucose: 114 (calc)
eAG (mmol/L): 6.3 (calc)

## 2020-07-26 LAB — LIPID PANEL
Cholesterol: 230 mg/dL — ABNORMAL HIGH (ref <200)
HDL: 37 mg/dL — ABNORMAL LOW (ref >=40)
LDL Cholesterol (Calc): 166 mg/dL (calc) — ABNORMAL HIGH
Non-HDL Cholesterol (Calc): 193 mg/dL (calc) — ABNORMAL HIGH (ref <130)
Total CHOL/HDL Ratio: 6.2 (calc) — ABNORMAL HIGH (ref <5.0)
Triglycerides: 133 mg/dL (ref <150)

## 2021-01-13 ENCOUNTER — Encounter: Payer: 59 | Admitting: Adult Health

## 2021-01-19 ENCOUNTER — Encounter: Payer: 59 | Admitting: Adult Health

## 2021-11-28 ENCOUNTER — Encounter: Payer: Self-pay | Admitting: Adult Health

## 2021-11-28 NOTE — Progress Notes (Signed)
Complete Physical  Assessment and Plan:  Diagnoses and all orders for this visit:  Encounter for Annual Physical Exam with abnormal findings Due annually  Health Maintenance reviewed Healthy lifestyle reviewed and goals set  Hyperlipidemia, unspecified hyperlipidemia type Statin myopathy Consider zetia Lipid clinic referral/cardio IQ if gets insurance  Continue low cholesterol diet and exercise.  Check lipid panel.  -     Lipid panel -     TSH  Vitamin D deficiency Hasn't started supplement; Defer checking level, historically has been persistently low   retry 1000-2000 IU daily   Traumatic injury of right lower extremity, sequela Remote; no issues  Screening for cardiovascular condition -     EKG 12-Lead  Screening for hematuria or proteinuria -     Urinalysis w microscopic + reflex cultur  Medication management -     CBC with Differential/Platelet -     CMP/GFR -     Magnesium   Prediabetes Hx of prediabetes recently improved after quitting ETOH Discussed disease and risks Discussed diet/exercise, weight management  -     Hemoglobin A1c  Early remission alcohol abuse ([place]) Sober since 12/04/2018  Will restart AA meetings, sent in naltrexone Discussed home stress at length, healthy barriers, communication techniques with mother and wife Follow up 6 months or sooner if needed  Overweight - BMI 26 Long discussion about weight loss, diet, and exercise Recommended diet heavy in fruits and veggies and low in animal meats, cheeses, and dairy products, appropriate calorie intake Discussed appropriate weight for height, weight goal <185 lb discussed and set Follow up at 3 months  Tobacco use Discussed risks associated with tobacco use and advised to reduce or quit Patient not ready to do so; ambivalent; risks discussed at length, information given  Declines Chantix or other medication, has tried wellbutrin  Suggested nicotine patches, smoking cessation programs -  resources given  Will follow up at the next visit  Colon cancer screening Currently uninsured, colonoscopy/cologuard would be a financial burden, otherwise no family hx or concerning sx - Colon cancer is 3rd most diagnosed cancer and 2nd leading cause of death in both men and women 46 years of age and older. - They understand that this is not as sensitive or specific as a colonoscopy, FIT is approx 88%, and they are still recommended to get a colonoscopy as soon as able.    Orders Placed This Encounter  Procedures   CBC with Differential/Platelet   COMPLETE METABOLIC PANEL WITH GFR   Lipid panel   Urinalysis, Routine w reflex microscopic   Fecal Globin By Immunochemistry   PPD   EKG 12-Lead      Discussed med's effects and SE's. Screening labs and tests as requested with regular follow-up as recommended. Over 40 minutes of exam, counseling, chart review and critical decision making was performed  Future Appointments  Date Time Provider [place]  06/08/2022  9:30 AM Magda Bernheim, NP [facility] None  12/01/2022  2:00 PM Liane Comber, NP [facility] None     HPI BP 118/78    Pulse 76    Wt 195 lb (88.5 kg)    SpO2 98%    BMI 29.65 kg/m   Patient is a 45 y.o. Caucasian married male,  who presents for a complete physical. He has Hyperlipidemia; Vitamin D deficiency; Other abnormal glucose (prediabetes); Overweight (BMI 25.0-29.9); Alcohol use disorder, moderate, in early remission ([place]); Smoker; and Stress at home on their problem list.  Married with 4 children, 7-19  years old, 2 boys 2 girls. Retired from Physiological scientist, not currently working, has savings. Currently uninsured. Spending a lot of time with children. He requests TB testing, per volunteer requirements.   He has hx of alcohol binge-drinking and in late 2019 was admitted to inpatient 4 week intensive treatment at [facility] in [place], has done well since, has been mostly sober (excepting 3-4  limited episodes in the last year) since 12/04/2018. He was on naltrexone 50 mg with benefit. Anxiety resolved after quitting alcohol ( hx of med intolerance, celexa, zoloft, wellbutrin).   He reports significant stress at home, poor relationship with wife, he feels she is depressed, won't seek help, won't talk, lashes out and yells at him and children. Also his sister is addict, he has cut her out but his mother will call him and involve. Stress and above situations make him want to reach for alcohol. Strategies discussed. He is receptive to restarting AA meetings and feels this would help. He would like to restart naltrexone.   he currently continues to smoke 0.5 pack a day; discussed risks associated with smoking, patient is not ready to quit at this time, but is interested in doing so in the future with awareness of risks. Resources given.   BMI is Body mass index is 29.65 kg/m., he has been working on diet and exercise, he is active with children, plays sports (baseball seasonally). He does make sure to eat fruits and salads, avoids fried foods, limited bread.  Coffee does drink a pot a day. Wt Readings from Last 3 Encounters:  12/01/21 195 lb (88.5 kg)  07/25/20 192 lb (87.1 kg)  04/18/20 197 lb (89.4 kg)   Today their BP is BP: 118/78 He does not workout, but is at a physically intense job. He denies chest pain, shortness of breath, dizziness.   He is not on cholesterol medication, was on pravastatin and had myalgias, also with low dose low frequency rosuvastatin. His cholesterol is not at goal. The cholesterol last visit was:   Lab Results  Component Value Date   CHOL 230 (H) 07/25/2020   HDL 37 (L) 07/25/2020   LDLCALC 166 (H) 07/25/2020   TRIG 133 07/25/2020   CHOLHDL 6.2 (H) 07/25/2020   He has not been working on diet and exercise for prediabetes (predating 2014, 5.9% at that time) recently improved to normal range, he is not on bASA, he is not on ACE/ARB and denies increased  appetite, nausea, paresthesia of the feet, polydipsia, polyuria, visual disturbances, vomiting and weight loss. Last A1C in the office was:  Lab Results  Component Value Date   HGBA1C 5.6 07/25/2020   Last GFR: Lab Results  Component Value Date   [facility] 104 07/25/2020   Patient is not on Vitamin D supplement and low at last check:    Lab Results  Component Value Date   VD25OH 19 (L) 01/11/2019     Denies LUTs. Last PSA was: Lab Results  Component Value Date   PSA 0.47 11/27/2013     Current Medications:  No current outpatient medications on file prior to visit.   No current facility-administered medications on file prior to visit.   Allergies:  Allergies  Allergen Reactions   Celexa [Citalopram Hydrobromide]     No relief   Pravastatin     Myalgias   Rosuvastatin     Myalgia   Wellbutrin [Bupropion]     No relief   Zoloft [Sertraline Hcl]  aggitation   Health Maintenance:  Immunization History  Administered Date(s) Administered   PPD Test 11/27/2013, 02/26/2015, 12/01/2021   Td 11/29/2008   Tdap 11/27/2013   Tetanus: 2014 Pneumonia: defer - currently no insurance Flu vaccine: Declines Covid 19: Declines  PPD: 12/01/2020, return for reading in 48-72 hours  Colonoscopy: DUE - discussed colonoscopy, cologuard, FIT testing, benefits/risks/limitations. Will proceed with FIT testing due to uninsured, no direct family hx. If gets insurance will refer for colonoscopy.  EGD: n/a  Eye Exam: 2015, no issues Dentist: Never -  Encouraged to schedule at least annually  Patient Care Team: Unk Pinto, MD as PCP - General (Internal Medicine) Dorna Leitz, MD as Consulting Physician (Orthopedic Surgery) Frederik Pear, MD as Consulting Physician (Orthopedic Surgery)  Medical History:  has Hyperlipidemia; Vitamin D deficiency; Other abnormal glucose (prediabetes); Overweight (BMI 25.0-29.9); Alcohol use disorder, moderate, in early remission ([place]); Smoker; and  Stress at home on their problem list. Surgical History:  He  has a past surgical history that includes Debridement leg (Right); Ankle fracture surgery (Right, 1996); and Vasectomy (2014). Family History:  His family history includes Alcohol abuse in his paternal grandfather; Aneurysm in his maternal grandfather; Cancer in his mother; Depression in his father; Diabetes in his maternal aunt and sister; Heart disease in his maternal grandfather; Lung cancer in his paternal uncle; Stroke in his father. Social History:   reports that he has been smoking cigarettes. He started smoking about 35 years ago. He has a 16.50 pack-year smoking history. He has never used smokeless tobacco. He reports that he does not currently use alcohol. He reports that he does not use drugs.  Review of Systems:  Review of Systems  Constitutional:  Negative for malaise/fatigue and weight loss.  HENT:  Negative for hearing loss and tinnitus.   Eyes:  Negative for blurred vision and double vision.  Respiratory:  Negative for cough, shortness of breath and wheezing.   Cardiovascular:  Negative for chest pain, palpitations, orthopnea, claudication and leg swelling.  Gastrointestinal:  Negative for abdominal pain, blood in stool, constipation, diarrhea, heartburn, melena, nausea and vomiting.  Genitourinary: Negative.   Musculoskeletal:  Negative for joint pain and myalgias.  Skin:  Negative for rash.  Neurological:  Negative for dizziness, tingling, sensory change, weakness and headaches.  Endo/Heme/Allergies:  Negative for polydipsia.  Psychiatric/Behavioral:  Positive for depression and substance abuse (rare binging). Negative for hallucinations, memory loss and suicidal ideas. The patient is not nervous/anxious and does not have insomnia.   All other systems reviewed and are negative.  Physical Exam: Estimated body mass index is 29.65 kg/m as calculated from the following:   Height as of 04/18/20: 5\' 8"  (1.727 m).    Weight as of this encounter: 195 lb (88.5 kg). BP 118/78    Pulse 76    Wt 195 lb (88.5 kg)    SpO2 98%    BMI 29.65 kg/m  General Appearance: Well nourished, well dressed adult male, in no apparent distress.  Eyes: PERRLA, EOMs, conjunctiva no swelling or erythema Sinuses: No Frontal/maxillary tenderness  ENT/Mouth: Ext aud canals clear, normal light reflex with TMs without erythema, bulging. Poor dentition, several missing/broken with no abscess. No erythema, swelling, or exudate on post pharynx. Tonsils not swollen or erythematous. Sublingual glands normal. Hearing normal.  Neck: Supple, thyroid normal. No bruits  Respiratory: Respiratory effort normal, BS equal bilaterally without rales, rhonchi, wheezing or stridor.  Cardio: RRR without murmurs, rubs or gallops. Brisk peripheral pulses without edema.  Chest: symmetric, with normal excursions and percussion.  Abdomen: Soft, nontender, no guarding, rebound, hernias, masses, or organomegaly.  Lymphatics: Non tender without lymphadenopathy.  Genitourinary: No concerns; defer Musculoskeletal: Full ROM all peripheral extremities,5/5 strength, and normal gait. Chronic R ankle bony enlargement with well healed surgical scars. No effusion.  Skin: Warm, dry without rashes, lesions, ecchymosis. Neuro: Cranial nerves intact, reflexes equal bilaterally. Normal muscle tone, no cerebellar symptoms. Sensation intact.  Psych: Awake and oriented X 3, mildly depressed affect, Insight and Judgment appropriate.   EKG: WNL - sinus brady - no changes.  [name], NP 5:38 PM [facility]

## 2021-12-01 ENCOUNTER — Encounter: Payer: Self-pay | Admitting: Adult Health

## 2021-12-01 ENCOUNTER — Ambulatory Visit (INDEPENDENT_AMBULATORY_CARE_PROVIDER_SITE_OTHER): Payer: Self-pay | Admitting: Adult Health

## 2021-12-01 ENCOUNTER — Other Ambulatory Visit: Payer: Self-pay

## 2021-12-01 VITALS — BP 118/78 | HR 76 | Wt 195.0 lb

## 2021-12-01 DIAGNOSIS — F439 Reaction to severe stress, unspecified: Secondary | ICD-10-CM | POA: Insufficient documentation

## 2021-12-01 DIAGNOSIS — R7309 Other abnormal glucose: Secondary | ICD-10-CM

## 2021-12-01 DIAGNOSIS — Z111 Encounter for screening for respiratory tuberculosis: Secondary | ICD-10-CM

## 2021-12-01 DIAGNOSIS — Z79899 Other long term (current) drug therapy: Secondary | ICD-10-CM

## 2021-12-01 DIAGNOSIS — Z1211 Encounter for screening for malignant neoplasm of colon: Secondary | ICD-10-CM

## 2021-12-01 DIAGNOSIS — F1021 Alcohol dependence, in remission: Secondary | ICD-10-CM

## 2021-12-01 DIAGNOSIS — G720 Drug-induced myopathy: Secondary | ICD-10-CM

## 2021-12-01 DIAGNOSIS — Z130 Encounter for screening for diseases of the blood and blood-forming organs and certain disorders involving the immune mechanism: Secondary | ICD-10-CM

## 2021-12-01 DIAGNOSIS — E782 Mixed hyperlipidemia: Secondary | ICD-10-CM

## 2021-12-01 DIAGNOSIS — F17200 Nicotine dependence, unspecified, uncomplicated: Secondary | ICD-10-CM

## 2021-12-01 DIAGNOSIS — Z0001 Encounter for general adult medical examination with abnormal findings: Secondary | ICD-10-CM

## 2021-12-01 DIAGNOSIS — Z1389 Encounter for screening for other disorder: Secondary | ICD-10-CM

## 2021-12-01 DIAGNOSIS — Z136 Encounter for screening for cardiovascular disorders: Secondary | ICD-10-CM

## 2021-12-01 DIAGNOSIS — E559 Vitamin D deficiency, unspecified: Secondary | ICD-10-CM

## 2021-12-01 DIAGNOSIS — E663 Overweight: Secondary | ICD-10-CM

## 2021-12-01 MED ORDER — NALTREXONE HCL 50 MG PO TABS: ORAL_TABLET | ORAL | 3 refills | Status: DC

## 2021-12-01 NOTE — Patient Instructions (Addendum)
Goals      LDL CALC < 100     Reduce caffeine intake     Cut back to 2-3 cups of coffee per day     Reduce/quit smoking     Weight (lb) < 185 lb (83.9 kg)       Know what a healthy weight is for you (roughly BMI <25) and aim to maintain this  Aim for 7+ servings of fruits and vegetables daily  65-80+ fluid ounces of water or unsweet tea for healthy kidneys  Limit to max 1 drink of alcohol per day; avoid smoking/tobacco  Limit animal fats in diet for cholesterol and heart health - choose grass fed whenever available  Avoid highly processed foods, and foods high in saturated/trans fats  Aim for low stress - take time to unwind and care for your mental health  Aim for 150 min of moderate intensity exercise weekly for heart health, and weights twice weekly for bone health  Aim for 7-9 hours of sleep daily     SMOKING CESSATION  [employer]  [phone] for more information or for a free program for smoking cessation help.   You can call QUIT SMART 1-800-QUIT-NOW for free nicotine patches or replacement therapy- if they are out- keep calling  [facility] Can call for smoking cessation classes, [phone]  If you have a smart phone, please look up Smoke Free app, this will help you stay on track and give you information about money you have saved, life that you have gained back and a ton of more information.     ADVANTAGES OF QUITTING SMOKING Within 20 minutes, blood pressure decreases. Your pulse is at normal level. After 8 hours, carbon monoxide levels in the blood return to normal. Your oxygen level increases. After 24 hours, the chance of having a heart attack starts to decrease. Your breath, hair, and body stop smelling like smoke. After 48 hours, damaged nerve endings begin to recover. Your sense of taste and smell improve. After 72 hours, the body is virtually free of nicotine. Your bronchial tubes relax and breathing becomes  easier. After 2 to 12 weeks, lungs can hold more air. Exercise becomes easier and circulation improves. After 1 year, the risk of coronary heart disease is cut in half. After 5 years, the risk of stroke falls to the same as a nonsmoker. After 10 years, the risk of lung cancer is cut in half and the risk of other cancers decreases significantly. After 15 years, the risk of coronary heart disease drops, usually to the level of a nonsmoker. You will have extra money to spend on things other than cigarettes.     Naltrexone tablets What is this medication? NALTREXONE (nal TREX one) helps you to remain free of your dependence on opiate drugs or alcohol. It blocks the 'high' that these substances can give you. This medicine is combined with counseling and support groups. This medicine may be used for other purposes; ask your health care provider or pharmacist if you have questions. COMMON BRAND NAME(S): Depade, ReVia What should I tell my care team before I take this medication? They need to know if you have any of these conditions: if you have used drugs or alcohol within 7 to 10 days kidney disease liver disease, including hepatitis an unusual or allergic reaction to naltrexone, other medicines, foods, dyes, or preservatives pregnant or trying to get pregnant breast-feeding How should I use this medication? Take this medicine by mouth  with a full glass of water. Follow the directions on the prescription label. Do not take this medicine within 7 to 10 days of taking any opioid drugs. Take your medicine at regular intervals. Do not take your medicine more often than directed. Do not stop taking except on your doctor's advice. Talk to your pediatrician regarding the use of this medicine in children. Special care may be needed. Overdosage: If you think you have taken too much of this medicine contact a poison control center or emergency room at once. NOTE: This medicine is only for you. Do not  share this medicine with others. What if I miss a dose? If you miss a dose and remember on the same day, take the missed dose. If you do not remember until the next day, ask your doctor or health care professional about rescheduling your doses. Do not take double or extra doses. What may interact with this medication? Do not take this medicine with any of the following medications: any prescription or street opioid drug like codiene, heroin, methadone This medicine may also interact with the following medications: disulfiram thioridazine This list may not describe all possible interactions. Give your health care provider a list of all the medicines, herbs, non-prescription drugs, or dietary supplements you use. Also tell them if you smoke, drink alcohol, or use illegal drugs. Some items may interact with your medicine. What should I watch for while using this medication? Your condition will be monitored carefully while you are receiving this medicine. Visit your doctor or health care professional regularly. For this medicine to be most effective you should attend any counseling or support groups that your doctor or health care professional recommends. Do not try to overcome the effects of the medicine by taking large amounts of narcotics or by drinking large amounts of alcohol. This can cause severe problems including death. Also, you may be more sensitive to lower doses of narcotics after you stop taking this medicine. If you are going to have surgery, tell your doctor or health care professional that you are taking this medicine. Do not treat yourself for coughs, colds, pain, or diarrhea. Ask your doctor or health care professional for advice. Some of the ingredients may interact with this medicine and cause side effects. Wear a medical ID bracelet or chain, and carry a card that describes your disease and details of your medicine and dosage times. You may get drowsy or dizzy. Do not drive, use  machinery, or do anything that needs mental alertness until you know how this medicine affects you. Do not stand or sit up quickly, especially if you are an older patient. This reduces the risk of dizzy or fainting spells. Alcohol may interfere with the effect of this medicine. Avoid alcoholic drinks. What side effects may I notice from receiving this medication? Side effects that you should report to your doctor or health care professional as soon as possible: allergic reactions like skin rash, itching or hives, swelling of the face, lips, or tongue breathing problems changes in vision, hearing confusion dark urine depressed mood diarrhea fast or irregular heart beat hallucination, loss of contact with reality light-colored stools right upper belly pain suicidal thoughts or other mood changes unusually weak or tired vomiting yellowing of the eyes or skin Side effects that usually do not require medical attention (report to your doctor or health care professional if they continue or are bothersome): aches, pains change in sex drive or performance feeling anxious headache loss of appetite, nausea  runny nose, sinus problems, sneezing stomach pain trouble sleeping This list may not describe all possible side effects. Call your doctor for medical advice about side effects. You may report side effects to FDA at 1-800-FDA-1088. Where should I keep my medication? Keep out of the reach of children. Store at room temperature between 20 and 25 degrees C (68 and 77 degrees F). Throw away any unused medicine after the expiration date. NOTE: This sheet is a summary. It may not cover all possible information. If you have questions about this medicine, talk to your doctor, pharmacist, or health care provider.  2022 Elsevier/Gold Standard (2012-09-09 00:00:00)    High-Fiber Eating Plan Fiber, also called dietary fiber, is a type of carbohydrate. It is found foods such as fruits, vegetables,  whole grains, and beans. A high-fiber diet can have many health benefits. Your health care provider may recommend a high-fiber diet to help: Prevent constipation. Fiber can make your bowel movements more regular. Lower your cholesterol. Relieve the following conditions: Inflammation of veins in the anus (hemorrhoids). Inflammation of specific areas of the digestive tract (uncomplicated diverticulosis). A problem of the large intestine, also called the colon, that sometimes causes pain and diarrhea (irritable bowel syndrome, or IBS). Prevent overeating as part of a weight-loss plan. Prevent heart disease, type 2 diabetes, and certain cancers. What are tips for following this plan? Reading food labels  Check the nutrition facts label on food products for the amount of dietary fiber. Choose foods that have 5 grams of fiber or more per serving. The goals for recommended daily fiber intake include: Men (age 47 or younger): 34-38 g. Men (over age 47): 28-34 g. Women (age 47 or younger): 25-28 g. Women (over age 47): 22-25 g. Your daily fiber goal is _____________ g. Shopping Choose whole fruits and vegetables instead of processed forms, such as apple juice or applesauce. Choose a wide variety of high-fiber foods such as avocados, lentils, oats, and kidney beans. Read the nutrition facts label of the foods you choose. Be aware of foods with added fiber. These foods often have high sugar and sodium amounts per serving. Cooking Use whole-grain flour for baking and cooking. Cook with brown rice instead of white rice. Meal planning Start the day with a breakfast that is high in fiber, such as a cereal that contains 5 g of fiber or more per serving. Eat breads and cereals that are made with whole-grain flour instead of refined flour or white flour. Eat brown rice, bulgur wheat, or millet instead of white rice. Use beans in place of meat in soups, salads, and pasta dishes. Be sure that half of the  grains you eat each day are whole grains. General information You can get the recommended daily intake of dietary fiber by: Eating a variety of fruits, vegetables, grains, nuts, and beans. Taking a fiber supplement if you are not able to take in enough fiber in your diet. It is better to get fiber through food than from a supplement. Gradually increase how much fiber you consume. If you increase your intake of dietary fiber too quickly, you may have bloating, cramping, or gas. Drink plenty of water to help you digest fiber. Choose high-fiber snacks, such as berries, raw vegetables, nuts, and popcorn. What foods should I eat? Fruits Berries. Pears. Apples. Oranges. Avocado. Prunes and raisins. Dried figs. Vegetables Sweet potatoes. Spinach. Kale. Artichokes. Cabbage. Broccoli. Cauliflower. Green peas. Carrots. Squash. Grains Whole-grain breads. Multigrain cereal. Oats and oatmeal. Brown rice. Barley. Bulgur  wheat. Millet. Quinoa. Bran muffins. Popcorn. Rye wafer crackers. Meats and other proteins Navy beans, kidney beans, and pinto beans. Soybeans. Split peas. Lentils. Nuts and seeds. Dairy Fiber-fortified yogurt. Beverages Fiber-fortified soy milk. Fiber-fortified orange juice. Other foods Fiber bars. The items listed above may not be a complete list of recommended foods and beverages. Contact a dietitian for more information. What foods should I avoid? Fruits Fruit juice. Cooked, strained fruit. Vegetables Fried potatoes. Canned vegetables. Well-cooked vegetables. Grains White bread. Pasta made with refined flour. White rice. Meats and other proteins Fatty cuts of meat. Fried chicken or fried fish. Dairy Milk. Yogurt. Cream cheese. Sour cream. Fats and oils Butters. Beverages Soft drinks. Other foods Cakes and pastries. The items listed above may not be a complete list of foods and beverages to avoid. Talk with your dietitian about what choices are best for  you. Summary Fiber is a type of carbohydrate. It is found in foods such as fruits, vegetables, whole grains, and beans. A high-fiber diet has many benefits. It can help to prevent constipation, lower blood cholesterol, aid weight loss, and reduce your risk of heart disease, diabetes, and certain cancers. Increase your intake of fiber gradually. Increasing fiber too quickly may cause cramping, bloating, and gas. Drink plenty of water while you increase the amount of fiber you consume. The best sources of fiber include whole fruits and vegetables, whole grains, nuts, seeds, and beans. This information is not intended to replace advice given to you by your health care provider. Make sure you discuss any questions you have with your health care provider. Document Revised: 03/20/2020 Document Reviewed: 03/20/2020 Elsevier Patient Education  2022 [employer].

## 2021-12-02 LAB — COMPLETE METABOLIC PANEL WITH GFR
AG Ratio: 1.6 (calc) (ref 1.0–2.5)
ALT: 27 U/L (ref 9–46)
AST: 17 U/L (ref 10–40)
Albumin: 4.5 g/dL (ref 3.6–5.1)
Alkaline phosphatase (APISO): 76 U/L (ref 36–130)
BUN: 15 mg/dL (ref 7–25)
CO2: 28 mmol/L (ref 20–32)
Calcium: 9.8 mg/dL (ref 8.6–10.3)
Chloride: 102 mmol/L (ref 98–110)
Creat: 0.84 mg/dL (ref 0.60–1.29)
Globulin: 2.8 g/dL (calc) (ref 1.9–3.7)
Glucose, Bld: 65 mg/dL (ref 65–99)
Potassium: 4.3 mmol/L (ref 3.5–5.3)
Sodium: 137 mmol/L (ref 135–146)
Total Bilirubin: 0.4 mg/dL (ref 0.2–1.2)
Total Protein: 7.3 g/dL (ref 6.1–8.1)
eGFR: 109 mL/min/{1.73_m2} (ref >=60)

## 2021-12-02 LAB — CBC WITH DIFFERENTIAL/PLATELET
Absolute Monocytes: 1104 cells/uL — ABNORMAL HIGH (ref 200–950)
Basophils Absolute: 35 cells/uL (ref 0–200)
Basophils Relative: 0.3 %
Eosinophils Absolute: 196 cells/uL (ref 15–500)
Eosinophils Relative: 1.7 %
HCT: 45.9 % (ref 38.5–50.0)
Hemoglobin: 15.6 g/dL (ref 13.2–17.1)
Lymphs Abs: 3853 cells/uL (ref 850–3900)
MCH: 31.9 pg (ref 27.0–33.0)
MCHC: 34 g/dL (ref 32.0–36.0)
MCV: 93.9 fL (ref 80.0–100.0)
MPV: 11.3 fL (ref 7.5–12.5)
Monocytes Relative: 9.6 %
Neutro Abs: 6314 cells/uL (ref 1500–7800)
Neutrophils Relative %: 54.9 %
Platelets: 212 10*3/uL (ref 140–400)
RBC: 4.89 10*6/uL (ref 4.20–5.80)
RDW: 12.3 % (ref 11.0–15.0)
Total Lymphocyte: 33.5 %
WBC: 11.5 10*3/uL — ABNORMAL HIGH (ref 3.8–10.8)

## 2021-12-02 LAB — URINALYSIS, ROUTINE W REFLEX MICROSCOPIC
Bilirubin Urine: NEGATIVE
Glucose, UA: NEGATIVE
Hgb urine dipstick: NEGATIVE
Ketones, ur: NEGATIVE
Leukocytes,Ua: NEGATIVE
Nitrite: NEGATIVE
Protein, ur: NEGATIVE
Specific Gravity, Urine: 1.028 (ref 1.001–1.035)
pH: <=5 (ref 5.0–8.0)

## 2021-12-02 LAB — LIPID PANEL
Cholesterol: 227 mg/dL — ABNORMAL HIGH (ref <200)
HDL: 48 mg/dL (ref >=40)
LDL Cholesterol (Calc): 139 mg/dL (calc) — ABNORMAL HIGH
Non-HDL Cholesterol (Calc): 179 mg/dL (calc) — ABNORMAL HIGH (ref <130)
Total CHOL/HDL Ratio: 4.7 (calc) (ref <5.0)
Triglycerides: 261 mg/dL — ABNORMAL HIGH (ref <150)

## 2021-12-03 ENCOUNTER — Other Ambulatory Visit: Payer: Self-pay | Admitting: Adult Health

## 2021-12-03 LAB — TB SKIN TEST
Induration: 0 mm
TB Skin Test: NEGATIVE

## 2021-12-08 LAB — FECAL GLOBIN BY IMMUNOCHEMISTRY
FECAL GLOBIN RESULT:: NOT DETECTED
MICRO NUMBER:: 12844622
SPECIMEN QUALITY:: ADEQUATE

## 2022-01-19 ENCOUNTER — Encounter: Payer: Self-pay | Admitting: Adult Health

## 2022-06-07 NOTE — Progress Notes (Deleted)
FOLLOW UP  Assessment and Plan:   Cholesterol Currently above goal; ran out of med and insurance lapsed, was expensive so didn't restart Reviewed GoodRx resources; resent to restart  LDL goal <100 Continue low cholesterol diet and exercise.  Check lipid panel.  - CMP, CBC  Other abnormal glucose Continue diet and exercise.  Perform daily foot/skin check, notify office of any concerning changes.  Check A1C   Overweight Long discussion about weight loss, diet, and exercise Recommended diet heavy in fruits and veggies and low in animal meats, cheeses, and dairy products, appropriate calorie intake Discussed ideal weight for height  Patient will work on cutting down on meat intake, portions, highly processed foods Will follow up in 3 months  Vitamin D Def Below goal at last visit; he has not initiated supplementation Encouraged supplementation for goal of 60-100 Defer Vit D level to CPE  Tobacco use Discussed risks associated with tobacco use and advised to reduce or quit Patient is ready to do so; discussed at length, resources provided on AVS, declines chantix or other medication Will follow up at the next visit  Binge drinking Patient reports not longer binge drinking; off of naltrexone and reports continues to do well   Medication Management - Magnesium   Continue diet and meds as discussed. Further disposition pending results of labs. Discussed med's effects and SE's.   Over 30 minutes of exam, counseling, chart review, and critical decision making was performed.   Future Appointments  Date Time Provider [place]  06/08/2022  9:30 AM Alycia Rossetti, NP [facility] None  12/01/2022  2:00 PM Darrol Jump, NP [facility] None    ----------------------------------------------------------------------------------------------------------------------  HPI 46 y.o. male, married with 4 children, presents for 6 month follow up on cholesterol, glucose management,  weight, smoking/bringe drinking, anxiety and vitamin D deficiency.   He has hx of alcohol binge- drinking. In Jan 2020 was admitted to inpatient 4 week intensive treatment at [facility] in [place]. He has done well since this, tapered off of naltrexone, was doing AA until pandemic. Reports he continues to do well. "Not a problem any more."  He does still communicate with people he met through [employer] and [employer].  Anxiety resolved since quitting alcohol.   he currently continues to smoke 0.5 pack a day; discussed risks associated with smoking, patient is ready to quit, plans to start meditating.   BMI is There is no height or weight on file to calculate BMI., he has not been working on diet and exercise; recently retired from physically intense job, has been playing ball with kids, wants to start hiking, generally active Wt Readings from Last 3 Encounters:  12/01/21 195 lb (88.5 kg)  07/25/20 192 lb (87.1 kg)  04/18/20 197 lb (89.4 kg)   Today their BP is    He does not workout. He denies chest pain, shortness of breath, dizziness.   He is on cholesterol medication - prescribed rosuvastatin 5 mg daily, but was taking every 2-3 days due to myalgias. HE admits ran out of med, insurance was lapsed, hasn't picked up since getting back insurance. The cholesterol last visit was:   Lab Results  Component Value Date   CHOL 227 (H) 12/01/2021   HDL 48 12/01/2021   LDLCALC 139 (H) 12/01/2021   TRIG 261 (H) 12/01/2021   CHOLHDL 4.7 12/01/2021    He has not been working on diet and exercise for glucose management, and denies hyperglycemia, increased appetite, nausea, paresthesia of the feet,  polydipsia and polyuria. Last A1C in the office was:  Lab Results  Component Value Date   HGBA1C 5.6 07/25/2020   Patient is not on Vitamin D supplement, reports has had bone aching and upset stomach with supplement with multiple attempts.  Lab Results  Component Value Date   VD25OH 19 (L)  01/11/2019       Current Medications:  Current Outpatient Medications on File Prior to Visit  Medication Sig   naltrexone (DEPADE) 50 MG tablet Take 1 tab daily for alcohol cravings.   No current facility-administered medications on file prior to visit.     Allergies:  Allergies  Allergen Reactions   Celexa [Citalopram Hydrobromide]     No relief   Pravastatin     Myalgias   Rosuvastatin     Myalgia   Wellbutrin [Bupropion]     No relief   Zoloft [Sertraline Hcl]     aggitation     Medical History:  Past Medical History:  Diagnosis Date   Alcohol consumption binge drinking 09/14/2018   Generalized anxiety disorder 02/27/2015   Herpes simplex type 1 antibody positive    Hyperlipidemia    Prediabetes 12/29/2017   Traumatic leg injury 11/28/2013   Vitamin D deficiency    Family history- Reviewed and unchanged Social history- Reviewed and unchanged   Review of Systems:  Review of Systems  Constitutional:  Negative for malaise/fatigue and weight loss.  HENT:  Negative for hearing loss and tinnitus.   Eyes:  Negative for blurred vision and double vision.  Respiratory:  Negative for cough, shortness of breath and wheezing.   Cardiovascular:  Negative for chest pain, palpitations, orthopnea, claudication and leg swelling.  Gastrointestinal:  Negative for abdominal pain, blood in stool, constipation, diarrhea, heartburn, melena, nausea and vomiting.  Genitourinary: Negative.   Musculoskeletal:  Negative for joint pain and myalgias.  Skin:  Negative for rash.  Neurological:  Negative for dizziness, tingling, sensory change, weakness and headaches.  Endo/Heme/Allergies:  Negative for polydipsia.  Psychiatric/Behavioral:  Negative for depression, hallucinations, substance abuse and suicidal ideas. The patient is not nervous/anxious and does not have insomnia.   All other systems reviewed and are negative.    Physical Exam: There were no vitals taken for this visit. Wt  Readings from Last 3 Encounters:  12/01/21 195 lb (88.5 kg)  07/25/20 192 lb (87.1 kg)  04/18/20 197 lb (89.4 kg)   General Appearance: Well nourished, in no apparent distress. Eyes: PERRLA, EOMs, conjunctiva no swelling or erythema Sinuses: No Frontal/maxillary tenderness ENT/Mouth: Ext aud canals clear, TMs without erythema, bulging. No erythema, swelling, or exudate on post pharynx.  Tonsils not swollen or erythematous. Hearing normal.  Neck: Supple, thyroid normal.  Respiratory: Respiratory effort normal, BS equal bilaterally without rales, rhonchi, wheezing or stridor.  Cardio: RRR with no MRGs. Brisk peripheral pulses without edema.  Abdomen: Soft, + BS.  Non tender, no guarding, rebound, hernias, masses. Lymphatics: Non tender without lymphadenopathy.  Musculoskeletal: Full ROM, 5/5 strength, Normal gait. Skin: Warm, dry without rashes, lesions, ecchymosis.  Neuro: Cranial nerves intact. No cerebellar symptoms.  Psych: Awake and oriented X 3, normal affect, Insight and Judgment appropriate.    Alycia Rossetti, NP 9:12 AM [facility]

## 2022-06-08 ENCOUNTER — Ambulatory Visit: Payer: Self-pay | Admitting: Nurse Practitioner

## 2022-06-08 DIAGNOSIS — E663 Overweight: Secondary | ICD-10-CM

## 2022-06-08 DIAGNOSIS — F1021 Alcohol dependence, in remission: Secondary | ICD-10-CM

## 2022-06-08 DIAGNOSIS — F17200 Nicotine dependence, unspecified, uncomplicated: Secondary | ICD-10-CM

## 2022-06-08 DIAGNOSIS — Z79899 Other long term (current) drug therapy: Secondary | ICD-10-CM

## 2022-06-08 DIAGNOSIS — R7309 Other abnormal glucose: Secondary | ICD-10-CM

## 2022-06-08 DIAGNOSIS — E782 Mixed hyperlipidemia: Secondary | ICD-10-CM

## 2022-06-08 DIAGNOSIS — E559 Vitamin D deficiency, unspecified: Secondary | ICD-10-CM

## 2022-12-01 ENCOUNTER — Encounter: Payer: Self-pay | Admitting: Nurse Practitioner

## 2022-12-01 DIAGNOSIS — Z0000 Encounter for general adult medical examination without abnormal findings: Secondary | ICD-10-CM

## 2022-12-01 NOTE — Progress Notes (Deleted)
Complete Physical  Assessment and Plan:  Diagnoses and all orders for this visit:  Encounter for Annual Physical Exam with abnormal findings Due annually  Health Maintenance reviewed Healthy lifestyle reviewed and goals set  Hyperlipidemia, unspecified hyperlipidemia type Statin myopathy Consider zetia Lipid clinic referral/cardio IQ if gets insurance  Continue low cholesterol diet and exercise.  Check lipid panel.  -     Lipid panel -     TSH  Vitamin D deficiency Hasn't started supplement; Defer checking level, historically has been persistently low   retry 1000-2000 IU daily   Traumatic injury of right lower extremity, sequela Remote; no issues  Screening for cardiovascular condition -     EKG 12-Lead  Screening for hematuria or proteinuria -     Urinalysis w microscopic + reflex cultur  Medication management -     CBC with Differential/Platelet -     CMP/GFR -     Magnesium   Prediabetes Hx of prediabetes recently improved after quitting ETOH Discussed disease and risks Discussed diet/exercise, weight management  -     Hemoglobin A1c  Early remission alcohol abuse (HCC) Sober since 12/04/2018  Will restart AA meetings, sent in naltrexone Discussed home stress at length, healthy barriers, communication techniques with mother and wife Follow up 6 months or sooner if needed  Overweight - BMI 29 Long discussion about weight loss, diet, and exercise Recommended diet heavy in fruits and veggies and low in animal meats, cheeses, and dairy products, appropriate calorie intake Discussed appropriate weight for height, weight goal <185 lb discussed and set Follow up at 3 months  Tobacco use Discussed risks associated with tobacco use and advised to reduce or quit Patient not ready to do so; ambivalent; risks discussed at length, information given  Declines Chantix or other medication, has tried wellbutrin  Suggested nicotine patches, smoking cessation programs -  resources given  Will follow up at the next visit  Colon cancer screening Currently uninsured, colonoscopy/cologuard would be a financial burden, otherwise no family hx or concerning sx - Colon cancer is 3rd most diagnosed cancer and 2nd leading cause of death in both men and women 35 years of age and older. - They understand that this is not as sensitive or specific as a colonoscopy, FIT is approx 88%, and they are still recommended to get a colonoscopy as soon as able.    No orders of the defined types were placed in this encounter.     Discussed med's effects and SE's. Screening labs and tests as requested with regular follow-up as recommended. Over 40 minutes of exam, counseling, chart review and critical decision making was performed  Future Appointments  Date Time Provider Department Center  12/05/2023  2:00 PM Shelbee Apgar, Archie Patten, NP [facility] None     HPI There were no vitals taken for this visit.  Patient is a 47 y.o. Caucasian married male,  who presents for a complete physical. He has Hyperlipidemia; Vitamin D deficiency; Other abnormal glucose (prediabetes); Overweight (BMI 25.0-29.9); Alcohol use disorder, moderate, in early remission (HCC); Smoker; and Stress at home on their problem list.  Married with 4 children, 35-58 years old, 2 boys 2 girls. Retired from [employer], not currently working, has savings. Currently uninsured. Spending a lot of time with children.    He has hx of alcohol binge-drinking and in late 2019 was admitted to inpatient 4 week intensive treatment at [facility] in [place], has done well since, has been mostly sober (excepting 3-4 limited  episodes in the last year) since 12/04/2018. He was on naltrexone 50 mg with benefit. Anxiety resolved after quitting alcohol ( hx of med intolerance, celexa, zoloft, wellbutrin).   He reports significant stress at home, poor relationship with wife, he feels she is depressed, won't seek help, won't  talk, lashes out and yells at him and children. Also his sister is addict, he has cut her out but his mother will call him and involve. Stress and above situations make him want to reach for alcohol. Strategies discussed. He is receptive to restarting AA meetings and feels this would help. He would like to restart naltrexone.   he currently continues to smoke 0.5 pack a day; discussed risks associated with smoking, patient is not ready to quit at this time, but is interested in doing so in the future with awareness of risks. Resources given.   BMI is There is no height or weight on file to calculate BMI., he has been working on diet and exercise, he is active with children, plays sports (baseball seasonally). He does make sure to eat fruits and salads, avoids fried foods, limited bread.  Coffee does drink a pot a day. Wt Readings from Last 3 Encounters:  12/01/21 195 lb (88.5 kg)  07/25/20 192 lb (87.1 kg)  04/18/20 197 lb (89.4 kg)   Today their BP is   He does not workout, but is at a physically intense job. He denies chest pain, shortness of breath, dizziness.   He is not on cholesterol medication, was on pravastatin and had myalgias, also with low dose low frequency rosuvastatin. His cholesterol is not at goal. The cholesterol last visit was:   Lab Results  Component Value Date   CHOL 227 (H) 12/01/2021   HDL 48 12/01/2021   LDLCALC 139 (H) 12/01/2021   TRIG 261 (H) 12/01/2021   CHOLHDL 4.7 12/01/2021   He has not been working on diet and exercise for prediabetes (predating 2014, 5.9% at that time) recently improved to normal range, he is not on bASA, he is not on ACE/ARB and denies increased appetite, nausea, paresthesia of the feet, polydipsia, polyuria, visual disturbances, vomiting and weight loss. Last A1C in the office was:  Lab Results  Component Value Date   HGBA1C 5.6 07/25/2020   Last GFR: Lab Results  Component Value Date   [facility] 104 07/25/2020   Patient is not on  Vitamin D supplement and low at last check:    Lab Results  Component Value Date   VD25OH 19 (L) 01/11/2019     Denies LUTs. Last PSA was: Lab Results  Component Value Date   PSA 0.47 11/27/2013     Current Medications:  Current Outpatient Medications on File Prior to Visit  Medication Sig Dispense Refill   naltrexone (DEPADE) 50 MG tablet Take 1 tab daily for alcohol cravings. 90 tablet 3   No current facility-administered medications on file prior to visit.   Allergies:  Allergies  Allergen Reactions   Celexa [Citalopram Hydrobromide]     No relief   Pravastatin     Myalgias   Rosuvastatin     Myalgia   Wellbutrin [Bupropion]     No relief   Zoloft [Sertraline Hcl]     aggitation   Health Maintenance:  Immunization History  Administered Date(s) Administered   PPD Test 11/27/2013, 02/26/2015, 12/01/2021   Td 11/29/2008   Tdap 11/27/2013   Tetanus: 2014 Pneumonia: defer - currently no insurance Flu vaccine: Declines Covid 19: Declines  PPD: 12/01/2020, return for reading in 48-72 hours  Colonoscopy: DUE - discussed colonoscopy, cologuard, FIT testing, benefits/risks/limitations. Will proceed with FIT testing due to uninsured, no direct family hx. If gets insurance will refer for colonoscopy.  EGD: n/a  Eye Exam: 2015, no issues Dentist: Never -  Encouraged to schedule at least annually  Patient Care Team: Unk Pinto, MD as PCP - General (Internal Medicine) Dorna Leitz, MD as Consulting Physician (Orthopedic Surgery) Frederik Pear, MD as Consulting Physician (Orthopedic Surgery)  Medical History:  has Hyperlipidemia; Vitamin D deficiency; Other abnormal glucose (prediabetes); Overweight (BMI 25.0-29.9); Alcohol use disorder, moderate, in early remission ([place]); Smoker; and Stress at home on their problem list. Surgical History:  He  has a past surgical history that includes Debridement leg (Right); Ankle fracture surgery (Right, 1996); and Vasectomy  (2014). Family History:  His family history includes Alcohol abuse in his paternal grandfather; Aneurysm in his maternal grandfather; Cancer in his mother; Depression in his father; Diabetes in his maternal aunt and sister; Heart disease in his maternal grandfather; Lung cancer in his paternal uncle; Stroke in his father. Social History:   reports that he has been smoking cigarettes. He started smoking about 36 years ago. He has a 16.50 pack-year smoking history. He has never used smokeless tobacco. He reports that he does not currently use alcohol. He reports that he does not use drugs.  Review of Systems:  Review of Systems  Constitutional:  Negative for malaise/fatigue and weight loss.  HENT:  Negative for hearing loss and tinnitus.   Eyes:  Negative for blurred vision and double vision.  Respiratory:  Negative for cough, shortness of breath and wheezing.   Cardiovascular:  Negative for chest pain, palpitations, orthopnea, claudication and leg swelling.  Gastrointestinal:  Negative for abdominal pain, blood in stool, constipation, diarrhea, heartburn, melena, nausea and vomiting.  Genitourinary: Negative.   Musculoskeletal:  Negative for joint pain and myalgias.  Skin:  Negative for rash.  Neurological:  Negative for dizziness, tingling, sensory change, weakness and headaches.  Endo/Heme/Allergies:  Negative for polydipsia.  Psychiatric/Behavioral:  Positive for depression and substance abuse (rare binging). Negative for hallucinations, memory loss and suicidal ideas. The patient is not nervous/anxious and does not have insomnia.   All other systems reviewed and are negative.   Physical Exam: Estimated body mass index is 29.65 kg/m as calculated from the following:   Height as of 04/18/20: 5\' 8"  (1.727 m).   Weight as of 12/01/21: 195 lb (88.5 kg). There were no vitals taken for this visit. General Appearance: Well nourished, well dressed adult male, in no apparent distress.  Eyes:  PERRLA, EOMs, conjunctiva no swelling or erythema Sinuses: No Frontal/maxillary tenderness  ENT/Mouth: Ext aud canals clear, normal light reflex with TMs without erythema, bulging. Poor dentition, several missing/broken with no abscess. No erythema, swelling, or exudate on post pharynx. Tonsils not swollen or erythematous. Sublingual glands normal. Hearing normal.  Neck: Supple, thyroid normal. No bruits  Respiratory: Respiratory effort normal, BS equal bilaterally without rales, rhonchi, wheezing or stridor.  Cardio: RRR without murmurs, rubs or gallops. Brisk peripheral pulses without edema.  Chest: symmetric, with normal excursions and percussion.  Abdomen: Soft, nontender, no guarding, rebound, hernias, masses, or organomegaly.  Lymphatics: Non tender without lymphadenopathy.  Genitourinary: No concerns; defer Musculoskeletal: Full ROM all peripheral extremities,5/5 strength, and normal gait. Chronic R ankle bony enlargement with well healed surgical scars. No effusion.  Skin: Warm, dry without rashes, lesions, ecchymosis. Neuro: Cranial nerves intact,  reflexes equal bilaterally. Normal muscle tone, no cerebellar symptoms. Sensation intact.  Psych: Awake and oriented X 3, mildly depressed affect, Insight and Judgment appropriate.   EKG: WNL - sinus brady - no changes.  Adela Glimpse, NP 2:05 PM [facility]

## 2023-03-01 ENCOUNTER — Encounter: Payer: Self-pay | Admitting: Nurse Practitioner

## 2023-03-01 ENCOUNTER — Ambulatory Visit (INDEPENDENT_AMBULATORY_CARE_PROVIDER_SITE_OTHER): Payer: Self-pay | Admitting: Nurse Practitioner

## 2023-03-01 VITALS — BP 118/80 | HR 63 | Temp 98.2°F | Ht 67.0 in | Wt 195.6 lb

## 2023-03-01 DIAGNOSIS — Z79899 Other long term (current) drug therapy: Secondary | ICD-10-CM

## 2023-03-01 DIAGNOSIS — Z125 Encounter for screening for malignant neoplasm of prostate: Secondary | ICD-10-CM

## 2023-03-01 DIAGNOSIS — E663 Overweight: Secondary | ICD-10-CM

## 2023-03-01 DIAGNOSIS — I10 Essential (primary) hypertension: Secondary | ICD-10-CM

## 2023-03-01 DIAGNOSIS — F17200 Nicotine dependence, unspecified, uncomplicated: Secondary | ICD-10-CM

## 2023-03-01 DIAGNOSIS — F1091 Alcohol use, unspecified, in remission: Secondary | ICD-10-CM

## 2023-03-01 DIAGNOSIS — E559 Vitamin D deficiency, unspecified: Secondary | ICD-10-CM

## 2023-03-01 DIAGNOSIS — S8991XS Unspecified injury of right lower leg, sequela: Secondary | ICD-10-CM

## 2023-03-01 DIAGNOSIS — E782 Mixed hyperlipidemia: Secondary | ICD-10-CM

## 2023-03-01 DIAGNOSIS — Z0000 Encounter for general adult medical examination without abnormal findings: Secondary | ICD-10-CM

## 2023-03-01 DIAGNOSIS — R7309 Other abnormal glucose: Secondary | ICD-10-CM

## 2023-03-01 DIAGNOSIS — Z1389 Encounter for screening for other disorder: Secondary | ICD-10-CM

## 2023-03-01 DIAGNOSIS — Z111 Encounter for screening for respiratory tuberculosis: Secondary | ICD-10-CM

## 2023-03-01 DIAGNOSIS — Z1211 Encounter for screening for malignant neoplasm of colon: Secondary | ICD-10-CM

## 2023-03-01 DIAGNOSIS — Z0001 Encounter for general adult medical examination with abnormal findings: Secondary | ICD-10-CM

## 2023-03-01 DIAGNOSIS — Z136 Encounter for screening for cardiovascular disorders: Secondary | ICD-10-CM

## 2023-03-01 DIAGNOSIS — Z720 Tobacco use: Secondary | ICD-10-CM

## 2023-03-01 NOTE — Patient Instructions (Signed)

## 2023-03-01 NOTE — Progress Notes (Signed)
Complete Physical  Assessment and Plan:  Diagnoses and all orders for this visit:  Encounter for Annual Physical Exam with abnormal findings Due annually  Health Maintenance reviewed Healthy lifestyle reviewed and goals set  Hyperlipidemia, unspecified hyperlipidemia type Statin myopathy Discussed lifestyle modifications. Recommended diet heavy in fruits and veggies, omega 3's. Decrease consumption of animal meats, cheeses, and dairy products. Remain active and exercise as tolerated. Continue to monitor. Check lipids/TSH   Vitamin D deficiency Suggest Vitamin D supplement for goal of 60-100 Monitor Vitamin D levels  Traumatic injury of right lower extremity, sequela Remote; no issues Continue to monitor  Screening for cardiovascular condition -     EKG 12-Lead  Screening for hematuria or proteinuria -     Urinalysis w microscopic + reflex cultur  Medication management All medications discussed and reviewed in full. All questions and concerns regarding medications addressed.    Prediabetes Hx of prediabetes - improved after quitting ETOH Education: Reviewed 'ABCs' of diabetes management  Discussed goals to be met and/or maintained include A1C (<7) Blood pressure (<130/80) Cholesterol (LDL <70) Continue Eye Exam yearly  Continue Dental Exam Q6 mo Discussed dietary recommendations Discussed Physical Activity recommendations Check A1C   Alcohol abuse ([place]) Sober since 12/04/2018  Controlled Restart Naltrexone for any increase in cravings. Continue AA meetings, sent in naltrexone Discussed home stress at length, healthy barriers, communication techniques  Overweight - BMI 29 Discussed appropriate BMI Diet modification. Physical activity. Encouraged/praised to build confidence.  Tobacco use Declines Chantix or other medication, has tried wellbutrin  Discussed risks associated with tobacco use and advised to reduce or quit Patient not ready to do so;  ambivalent; risks discussed at length, information given  Suggested nicotine patches, smoking cessation programs - resources given  Continue to monitor  Screening for TB Return in 48-72 hours for reading   - PPD Skin Test  Screening for prostate cancer -PSA     Colon cancer screening Discussed cologuard - ordered.  Orders Placed This Encounter  Procedures   CBC with Differential/Platelet   COMPLETE METABOLIC PANEL WITH GFR   Lipid panel   TSH   Hemoglobin A1c   VITAMIN D 25 Hydroxy (Vit-D Deficiency, Fractures)   Urinalysis, Routine w reflex microscopic   Microalbumin / creatinine urine ratio   PSA   Cologuard   TB Skin Test    Order Specific Question:   Has patient ever tested positive?    Answer:   No   EKG 12-Lead    Notify office for further evaluation and treatment, questions or concerns if any reported s/s fail to improve.   The patient was advised to call back or seek an in-person evaluation if any symptoms worsen or if the condition fails to improve as anticipated.   Further disposition pending results of labs. Discussed med's effects and SE's.    I discussed the assessment and treatment plan with the patient. The patient was provided an opportunity to ask questions and all were answered. The patient agreed with the plan and demonstrated an understanding of the instructions.  Discussed med's effects and SE's. Screening labs and tests as requested with regular follow-up as recommended.  I provided 35 minutes of face-to-face time during this encounter including counseling, chart review, and critical decision making was preformed.  Today's Plan of Care is based on a patient-centered health care approach known as shared decision making - the decisions, tests and treatments allow for patient preferences and values to be balanced with clinical evidence.  Future Appointments  Date Time Provider [place]  02/29/2024  2:00 PM Elfie Costanza, Kenney Houseman, NP [facility]  None     HPI BP 118/80   Pulse 63   Temp 98.2 F (36.8 C)   Ht 5\' 7"  (1.702 m)   Wt 195 lb 9.6 oz (88.7 kg)   SpO2 98%   BMI 30.64 kg/m   Patient is a 47 y.o. Caucasian married male,  who presents for a complete physical. He has Hyperlipidemia; Vitamin D deficiency; Other abnormal glucose (prediabetes); Overweight (BMI 25.0-29.9); Alcohol use disorder, moderate, in early remission; Smoker; and Stress at home on their problem list.  Overall he reports feeling well today.  He has no new or additional concerns to present.   Married with 4 children, 2 boys 2 girls. Retired from [employer], not currently working, has savings. Currently uninsured. Spending a lot of time with children. He requests TB testing, per volunteer requirements.  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   He reports significant stress at home, poor relationship with wife, he feels she is depressed, won't seek help, won't talk, lashes out and yells at him and children. Also his sister is addict, he has cut her out but his mother will call him and involve.    He has hx of alcohol binge-drinking and in late 2019 was admitted to inpatient 4 week intensive treatment at [facility] in [place], has done well since, has been mostly sober (excepting 3-4 limited episodes in the last year) since 12/04/2018. He was on naltrexone 50 mg with benefit. Anxiety resolved after quitting alcohol ( hx of med intolerance, celexa, zoloft, wellbutrin). Stress and above situations make him want to reach for alcohol. Strategies discussed. He is receptive to restarting AA meetings and feels this would help. Has attending several but not currently.  Feels his drinking is under control is not currently or has been consuming alcohol.  No longer taking Naltrexone.    he currently continues to smoke 0.5 pack a day; discussed risks associated with smoking, patient is not ready to quit at this time.  BMI is Body  mass index is 30.64 kg/m., he has been working on diet and exercise, he is active with children, plays sports (baseball seasonally).  Wt Readings from Last 3 Encounters:  03/01/23 195 lb 9.6 oz (88.7 kg)  12/01/21 195 lb (88.5 kg)  07/25/20 192 lb (87.1 kg)   Today their BP is BP: 118/80 He does not workout, but is at a physically intense job. He denies chest pain, shortness of breath, dizziness.   He is not on cholesterol medication, was on pravastatin and had myalgias, also with low dose low frequency rosuvastatin. His cholesterol is not at goal. The cholesterol last visit was:   Lab Results  Component Value Date   CHOL 227 (H) 12/01/2021   HDL 48 12/01/2021   LDLCALC 139 (H) 12/01/2021   TRIG 261 (H) 12/01/2021   CHOLHDL 4.7 12/01/2021   He has not been working on diet and exercise for prediabetes (predating 2014, 5.9% at that time) recently improved to normal range, he is not on bASA, he is not on ACE/ARB and denies increased appetite, nausea, paresthesia of the feet, polydipsia, polyuria, visual disturbances, vomiting and weight loss. Last A1C in the office was:  Lab Results  Component Value Date   HGBA1C 5.6 07/25/2020   Last GFR: Lab Results  Component Value Date   [facility] 104 07/25/2020   Patient is not  on Vitamin D supplement and low at last check:    Lab Results  Component Value Date   VD25OH 19 (L) 01/11/2019     Denies LUTs. Last PSA was: Lab Results  Component Value Date   PSA 0.47 11/27/2013     Current Medications:  Current Outpatient Medications on File Prior to Visit  Medication Sig Dispense Refill   naltrexone (DEPADE) 50 MG tablet Take 1 tab daily for alcohol cravings. (Patient not taking: Reported on 03/01/2023) 90 tablet 3   No current facility-administered medications on file prior to visit.   Allergies:  Allergies  Allergen Reactions   Celexa [Citalopram Hydrobromide]     No relief   Pravastatin     Myalgias   Rosuvastatin     Myalgia    Wellbutrin [Bupropion]     No relief   Zoloft [Sertraline Hcl]     aggitation   Health Maintenance:  Immunization History  Administered Date(s) Administered   PPD Test 11/27/2013, 02/26/2015, 12/01/2021   Td 11/29/2008   Tdap 11/27/2013   Tetanus: 2014 Pneumonia: defer - currently no insurance Flu vaccine: Declines Covid 19: Declines  PPD: 03/01/23, return for reading in 48-72 hours  Colonoscopy: DUE - discussed cologuard. EGD: n/a  Eye Exam: 2015, no issues Dentist: Never -  Encouraged to schedule at least annually  Patient Care Team: Unk Pinto, MD as PCP - General (Internal Medicine) Dorna Leitz, MD as Consulting Physician (Orthopedic Surgery) Frederik Pear, MD as Consulting Physician (Orthopedic Surgery)  Medical History:  has Hyperlipidemia; Vitamin D deficiency; Other abnormal glucose (prediabetes); Overweight (BMI 25.0-29.9); Alcohol use disorder, moderate, in early remission; Smoker; and Stress at home on their problem list. Surgical History:  He  has a past surgical history that includes Debridement leg (Right); Ankle fracture surgery (Right, 1996); and Vasectomy (2014). Family History:  His family history includes Alcohol abuse in his paternal grandfather; Aneurysm in his maternal grandfather; Cancer in his mother; Depression in his father; Diabetes in his maternal aunt and sister; Heart disease in his maternal grandfather; Lung cancer in his paternal uncle; Stroke in his father. Social History:   reports that he has been smoking cigarettes. He started smoking about 36 years ago. He has a 16.50 pack-year smoking history. He has never used smokeless tobacco. He reports that he does not currently use alcohol. He reports that he does not use drugs.  Review of Systems:  Review of Systems  Constitutional:  Negative for malaise/fatigue and weight loss.  HENT:  Negative for hearing loss and tinnitus.   Eyes:  Negative for blurred vision and double vision.   Respiratory:  Negative for cough, shortness of breath and wheezing.   Cardiovascular:  Negative for chest pain, palpitations, orthopnea, claudication and leg swelling.  Gastrointestinal:  Negative for abdominal pain, blood in stool, constipation, diarrhea, heartburn, melena, nausea and vomiting.  Genitourinary: Negative.   Musculoskeletal:  Negative for joint pain and myalgias.  Skin:  Negative for rash.  Neurological:  Negative for dizziness, tingling, sensory change, weakness and headaches.  Endo/Heme/Allergies:  Negative for polydipsia.  Psychiatric/Behavioral:  Positive for depression and substance abuse (rare binging). Negative for hallucinations, memory loss and suicidal ideas. The patient is not nervous/anxious and does not have insomnia.   All other systems reviewed and are negative.   Physical Exam: Estimated body mass index is 30.64 kg/m as calculated from the following:   Height as of this encounter: 5\' 7"  (1.702 m).   Weight as of this encounter: 195  lb 9.6 oz (88.7 kg). BP 118/80   Pulse 63   Temp 98.2 F (36.8 C)   Ht 5\' 7"  (1.702 m)   Wt 195 lb 9.6 oz (88.7 kg)   SpO2 98%   BMI 30.64 kg/m  General Appearance: Well nourished, well dressed adult male, in no apparent distress.  Eyes: PERRLA, EOMs, conjunctiva no swelling or erythema Sinuses: No Frontal/maxillary tenderness  ENT/Mouth: Ext aud canals clear, normal light reflex with TMs without erythema, bulging. Poor dentition, several missing/broken with no abscess. No erythema, swelling, or exudate on post pharynx. Tonsils not swollen or erythematous. Sublingual glands normal. Hearing normal.  Neck: Supple, thyroid normal. No bruits  Respiratory: Respiratory effort normal, BS equal bilaterally without rales, rhonchi, wheezing or stridor.  Cardio: RRR without murmurs, rubs or gallops. Brisk peripheral pulses without edema.  Chest: symmetric, with normal excursions and percussion.  Abdomen: Soft, nontender, no guarding,  rebound, hernias, masses, or organomegaly.  Lymphatics: Non tender without lymphadenopathy.  Genitourinary: No concerns; defer Musculoskeletal: Full ROM all peripheral extremities,5/5 strength, and normal gait. Chronic R ankle bony enlargement with well healed surgical scars. No effusion.  Skin: Warm, dry without rashes, lesions, ecchymosis. Neuro: Cranial nerves intact, reflexes equal bilaterally. Normal muscle tone, no cerebellar symptoms. Sensation intact.  Psych: Awake and oriented X 3, mildly depressed affect, Insight and Judgment appropriate.   EKG: WNL - sinus brady - no changes.  Darrol Jump, NP 2:44 PM [facility]

## 2023-03-02 ENCOUNTER — Other Ambulatory Visit: Payer: Self-pay | Admitting: Nurse Practitioner

## 2023-03-02 DIAGNOSIS — R7989 Other specified abnormal findings of blood chemistry: Secondary | ICD-10-CM

## 2023-03-02 LAB — URINALYSIS, ROUTINE W REFLEX MICROSCOPIC
Bilirubin Urine: NEGATIVE
Glucose, UA: NEGATIVE
Hgb urine dipstick: NEGATIVE
Ketones, ur: NEGATIVE
Leukocytes,Ua: NEGATIVE
Nitrite: NEGATIVE
Protein, ur: NEGATIVE
Specific Gravity, Urine: 1.024 (ref 1.001–1.035)
pH: 5.5 (ref 5.0–8.0)

## 2023-03-02 LAB — HEMOGLOBIN A1C
Hgb A1c MFr Bld: 5.8 % of total Hgb — ABNORMAL HIGH (ref <5.7)
Mean Plasma Glucose: 120 mg/dL
eAG (mmol/L): 6.6 mmol/L

## 2023-03-02 LAB — COMPLETE METABOLIC PANEL WITH GFR
AG Ratio: 1.4 (calc) (ref 1.0–2.5)
ALT: 18 U/L (ref 9–46)
AST: 17 U/L (ref 10–40)
Albumin: 4.1 g/dL (ref 3.6–5.1)
Alkaline phosphatase (APISO): 77 U/L (ref 36–130)
BUN: 14 mg/dL (ref 7–25)
CO2: 26 mmol/L (ref 20–32)
Calcium: 9.5 mg/dL (ref 8.6–10.3)
Chloride: 104 mmol/L (ref 98–110)
Creat: 0.87 mg/dL (ref 0.60–1.29)
Globulin: 2.9 g/dL (calc) (ref 1.9–3.7)
Glucose, Bld: 78 mg/dL (ref 65–99)
Potassium: 4.3 mmol/L (ref 3.5–5.3)
Sodium: 138 mmol/L (ref 135–146)
Total Bilirubin: 0.3 mg/dL (ref 0.2–1.2)
Total Protein: 7 g/dL (ref 6.1–8.1)
eGFR: 107 mL/min/{1.73_m2} (ref >=60)

## 2023-03-02 LAB — CBC WITH DIFFERENTIAL/PLATELET
Absolute Monocytes: 1050 cells/uL — ABNORMAL HIGH (ref 200–950)
Basophils Absolute: 40 cells/uL (ref 0–200)
Basophils Relative: 0.4 %
Eosinophils Absolute: 210 cells/uL (ref 15–500)
Eosinophils Relative: 2.1 %
HCT: 42 % (ref 38.5–50.0)
Hemoglobin: 14.3 g/dL (ref 13.2–17.1)
Lymphs Abs: 3240 cells/uL (ref 850–3900)
MCH: 31.2 pg (ref 27.0–33.0)
MCHC: 34 g/dL (ref 32.0–36.0)
MCV: 91.7 fL (ref 80.0–100.0)
MPV: 10.8 fL (ref 7.5–12.5)
Monocytes Relative: 10.5 %
Neutro Abs: 5460 cells/uL (ref 1500–7800)
Neutrophils Relative %: 54.6 %
Platelets: 206 10*3/uL (ref 140–400)
RBC: 4.58 10*6/uL (ref 4.20–5.80)
RDW: 12.5 % (ref 11.0–15.0)
Total Lymphocyte: 32.4 %
WBC: 10 10*3/uL (ref 3.8–10.8)

## 2023-03-02 LAB — MICROALBUMIN / CREATININE URINE RATIO
Creatinine, Urine: 142 mg/dL (ref 20–320)
Microalb Creat Ratio: 4 mg/g creat (ref <30)
Microalb, Ur: 0.6 mg/dL

## 2023-03-02 LAB — LIPID PANEL
Cholesterol: 205 mg/dL — ABNORMAL HIGH (ref <200)
HDL: 41 mg/dL (ref >=40)
LDL Cholesterol (Calc): 129 mg/dL (calc) — ABNORMAL HIGH
Non-HDL Cholesterol (Calc): 164 mg/dL (calc) — ABNORMAL HIGH (ref <130)
Total CHOL/HDL Ratio: 5 (calc) — ABNORMAL HIGH (ref <5.0)
Triglycerides: 211 mg/dL — ABNORMAL HIGH (ref <150)

## 2023-03-02 LAB — PSA: PSA: 0.42 ng/mL (ref <=4.00)

## 2023-03-02 LAB — TSH: TSH: 4.72 mIU/L — ABNORMAL HIGH (ref 0.40–4.50)

## 2023-03-02 LAB — VITAMIN D 25 HYDROXY (VIT D DEFICIENCY, FRACTURES): Vit D, 25-Hydroxy: 27 ng/mL — ABNORMAL LOW (ref 30–100)

## 2023-03-03 ENCOUNTER — Ambulatory Visit
Admission: RE | Admit: 2023-03-03 | Discharge: 2023-03-03 | Disposition: A | Discharge status: Home / Self Care | Payer: No Typology Code available for payment source | Source: Ambulatory Visit | Attending: Nurse Practitioner | Admitting: Nurse Practitioner

## 2023-03-03 ENCOUNTER — Ambulatory Visit (INDEPENDENT_AMBULATORY_CARE_PROVIDER_SITE_OTHER): Payer: Self-pay

## 2023-03-03 DIAGNOSIS — Z111 Encounter for screening for respiratory tuberculosis: Secondary | ICD-10-CM

## 2023-03-07 LAB — TB SKIN TEST
Induration: 0 mm
TB Skin Test: NEGATIVE

## 2023-04-11 LAB — COLOGUARD: COLOGUARD: NEGATIVE

## 2023-07-04 ENCOUNTER — Ambulatory Visit: Payer: Self-pay | Admitting: Nurse Practitioner

## 2023-07-04 ENCOUNTER — Other Ambulatory Visit: Payer: Self-pay | Admitting: Nurse Practitioner

## 2023-07-04 ENCOUNTER — Encounter: Payer: Self-pay | Admitting: Nurse Practitioner

## 2023-07-04 VITALS — Temp 102.8°F | Ht 67.0 in

## 2023-07-04 DIAGNOSIS — U071 COVID-19: Secondary | ICD-10-CM

## 2023-07-04 MED ORDER — DEXAMETHASONE 1 MG PO TABS
ORAL_TABLET | ORAL | 0 refills | Status: DC
Start: 2023-07-04 — End: 2023-07-05

## 2023-07-04 NOTE — Progress Notes (Signed)
THIS ENCOUNTER IS A VIRTUAL VISIT DUE TO COVID-19 - PATIENT WAS NOT SEEN IN THE OFFICE.  PATIENT HAS CONSENTED TO VIRTUAL VISIT / TELEMEDICINE VISIT   Virtual Visit via telephone Note  I connected with  Tracy Barrett on 07/04/2023 by telephone.  I verified that I am speaking with the correct person using two identifiers.    I discussed the limitations of evaluation and management by telemedicine and the availability of in person appointments. The patient expressed understanding and agreed to proceed.  History of Present Illness:   47 y.o. patient contacted office reporting URI sx . he tested positive by home covid test. OV was conducted by telephone to minimize exposure. This patient was not vaccinated for covid 19    Sx began 4 days ago with fever(102. 8), fatigue, body aches, headaches, diarrhea  Treatments tried so far: None  Exposures: Unknown   Medications       Current Outpatient Medications (Other):    naltrexone (DEPADE) 50 MG tablet, Take 1 tab daily for alcohol cravings. (Patient not taking: Reported on 03/01/2023)  Allergies:  Allergies  Allergen Reactions   Celexa [Citalopram Hydrobromide]     No relief   Pravastatin     Myalgias   Rosuvastatin     Myalgia   Wellbutrin [Bupropion]     No relief   Zoloft [Sertraline Hcl]     aggitation    Problem list He has Hyperlipidemia; Vitamin D deficiency; Other abnormal glucose (prediabetes); Overweight (BMI 25.0-29.9); Alcohol use disorder, moderate, in early remission (HCC); Smoker; and Stress at home on their problem list.   Social History:   reports that he has been smoking cigarettes. He started smoking about 36 years ago. He has a 18.3 pack-year smoking history. He has never used smokeless tobacco. He reports that he does not currently use alcohol. He reports that he does not use drugs.  Observations/Objective:  General : Well sounding patient in no apparent distress HEENT: no hoarseness, no cough for  duration of visit Lungs: speaks in complete sentences, no audible wheezing, no apparent distress Neurological: alert, oriented x 3 Psychiatric: pleasant, judgement appropriate   Assessment and Plan:  Covid 19 Covid 19 positive per rapid screening test at home yesterday Risk factors include: overweight, abnormal glucose, hyperlipidemia, smoker  Symptoms are: mild Immue support reviewed - Vit C, Vit D, and Zinc Take tylenol PRN temp 101+ Push hydration Regular ambulation or calf exercises exercises for clot prevention and 81 mg ASA unless contraindicated Sx supportive therapy suggested Follow up via mychart or telephone if needed Advised patient obtain O2 monitor; present to ED if persistently <90% or with severe dyspnea, CP, fever uncontrolled by tylenol, confusion, sudden decline       Should remain in isolation 5 days from testing positive and then wear a mask when around other people for the following 5 days   Follow Up Instructions:  I discussed the assessment and treatment plan with the patient. The patient was provided an opportunity to ask questions and all were answered. The patient agreed with the plan and demonstrated an understanding of the instructions.   The patient was advised to call back or seek an in-person evaluation if the symptoms worsen or if the condition fails to improve as anticipated.  I provided 15 minutes of non-face-to-face time during this encounter.   Raynelle Dick, NP

## 2023-07-04 NOTE — Telephone Encounter (Signed)
He has medicaid and we do not participate.  He needs to call pharmacy to see if he can pay cash. He should get a medicaid provider.

## 2023-07-04 NOTE — Patient Instructions (Signed)
Immue support reviewed - Vit C, Vit D, and Zinc Take tylenol PRN temp 101+ Push hydration Regular ambulation or calf exercises exercises for clot prevention and 81 mg ASA unless contraindicated Sx supportive therapy suggested Follow up via mychart or telephone if needed Advised patient obtain O2 monitor; present to ED if persistently <90% or with severe dyspnea, CP, fever uncontrolled by tylenol, confusion, sudden decline       Should remain in isolation 5 days from testing positive and then wear a mask when around other people for the following 5 days

## 2023-12-05 ENCOUNTER — Encounter: Payer: Self-pay | Admitting: Nurse Practitioner

## 2024-02-29 ENCOUNTER — Encounter: Payer: Self-pay | Admitting: Nurse Practitioner
